# Patient Record
Sex: Female | Born: 1975 | Race: White | Hispanic: No | Marital: Married | Smoking: Former smoker
Health system: Southern US, Community
[De-identification: ages and names within clinical notes are randomized; demographics above are authoritative.]

## PROBLEM LIST (undated history)

## (undated) DIAGNOSIS — F329 Major depressive disorder, single episode, unspecified: Secondary | ICD-10-CM

## (undated) DIAGNOSIS — J45909 Unspecified asthma, uncomplicated: Secondary | ICD-10-CM

## (undated) DIAGNOSIS — F32A Depression, unspecified: Secondary | ICD-10-CM

## (undated) HISTORY — DX: Unspecified asthma, uncomplicated: J45.909

## (undated) HISTORY — PX: NASAL SINUS SURGERY: SHX719

---
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
## 2009-06-26 ENCOUNTER — Emergency Department: Payer: Self-pay | Admitting: Emergency Medicine

## 2010-05-19 IMAGING — CR RIGHT FOOT COMPLETE - 3+ VIEW
1 series · 3 of 3 positions shown · non-contrast
Comparison: none

REASON FOR EXAM: fall hurt foot
COMMENTS:

[Series 1: view not recorded · 0.17mm/px · 3 of 3 slices shown]
[im 1/3]
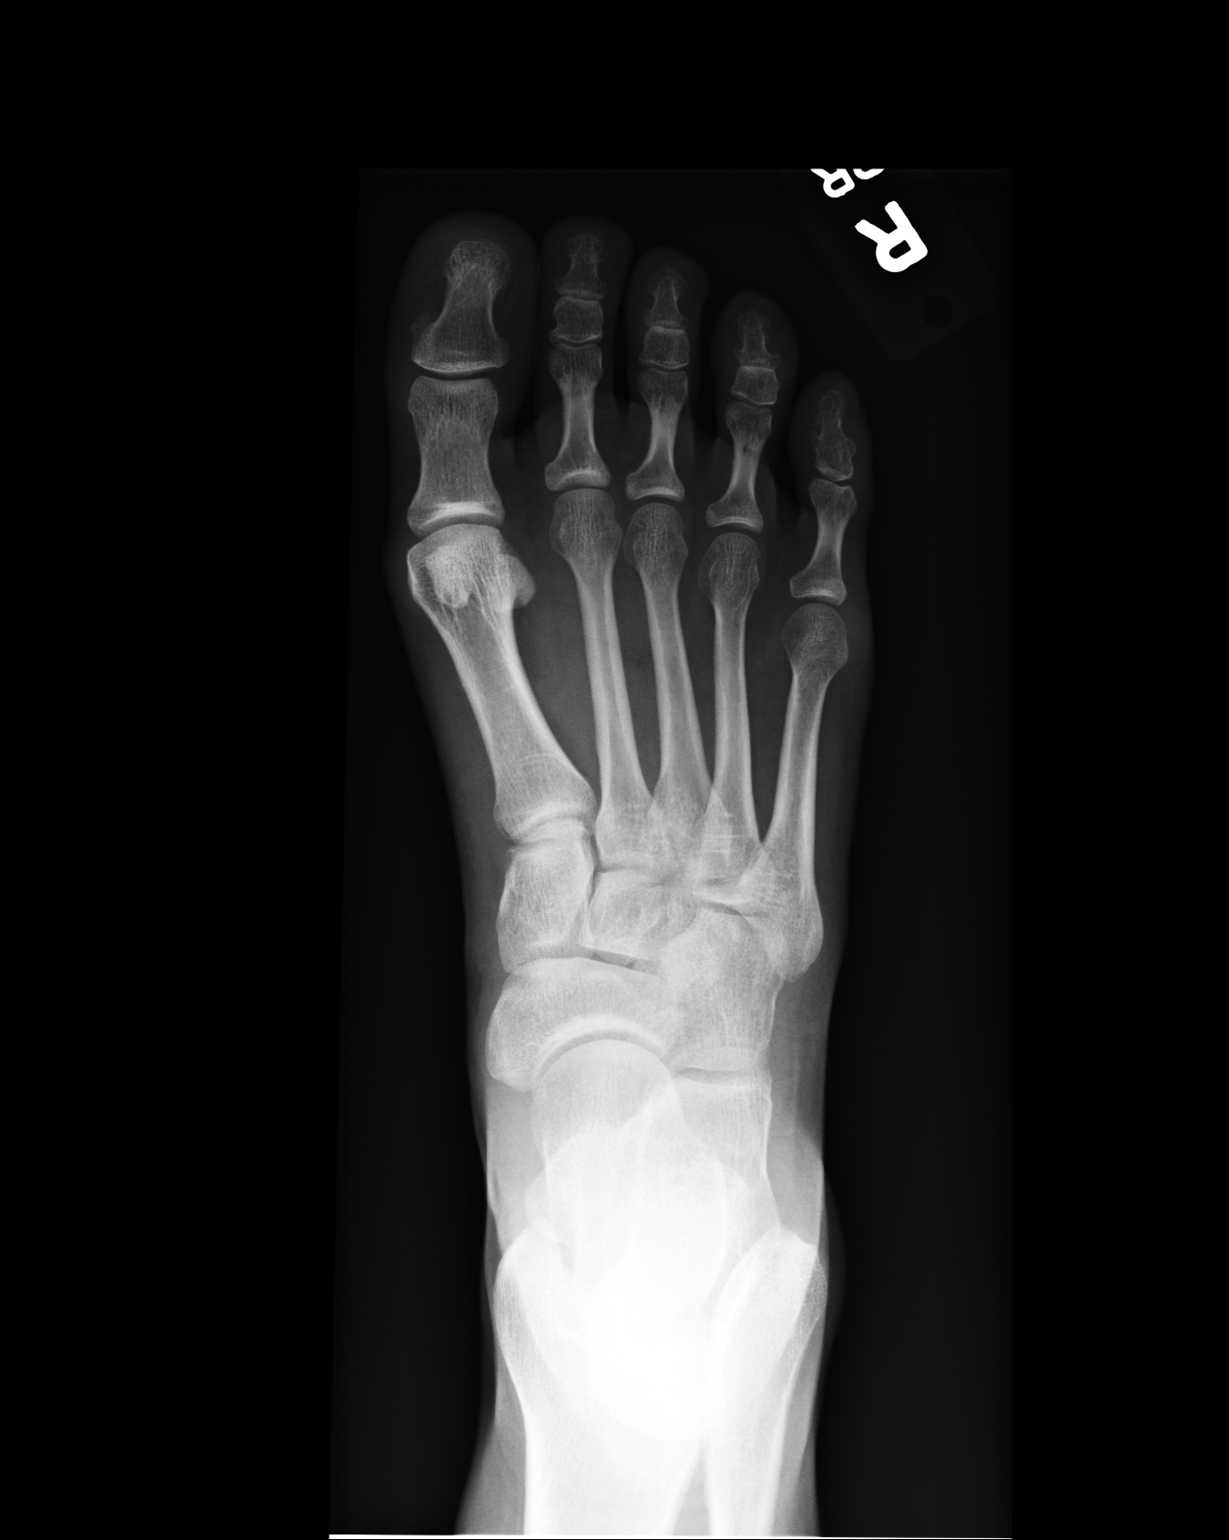
[im 2/3]
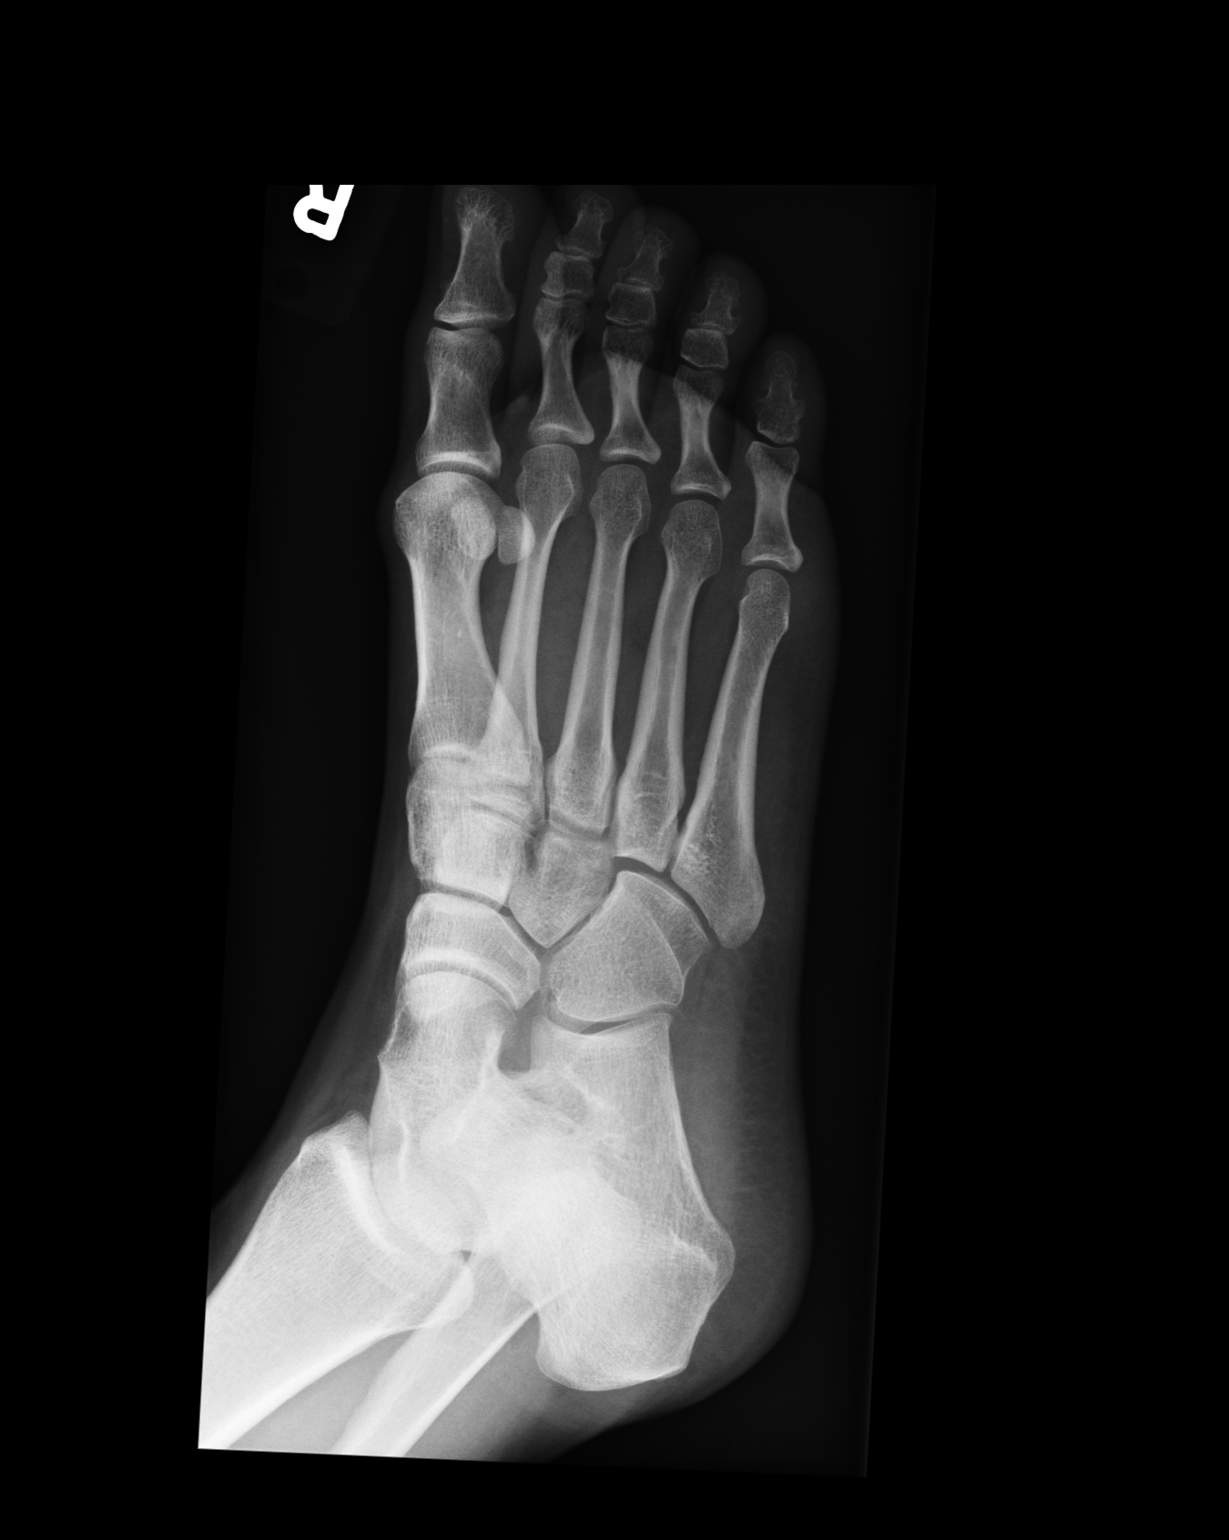
[im 3/3]
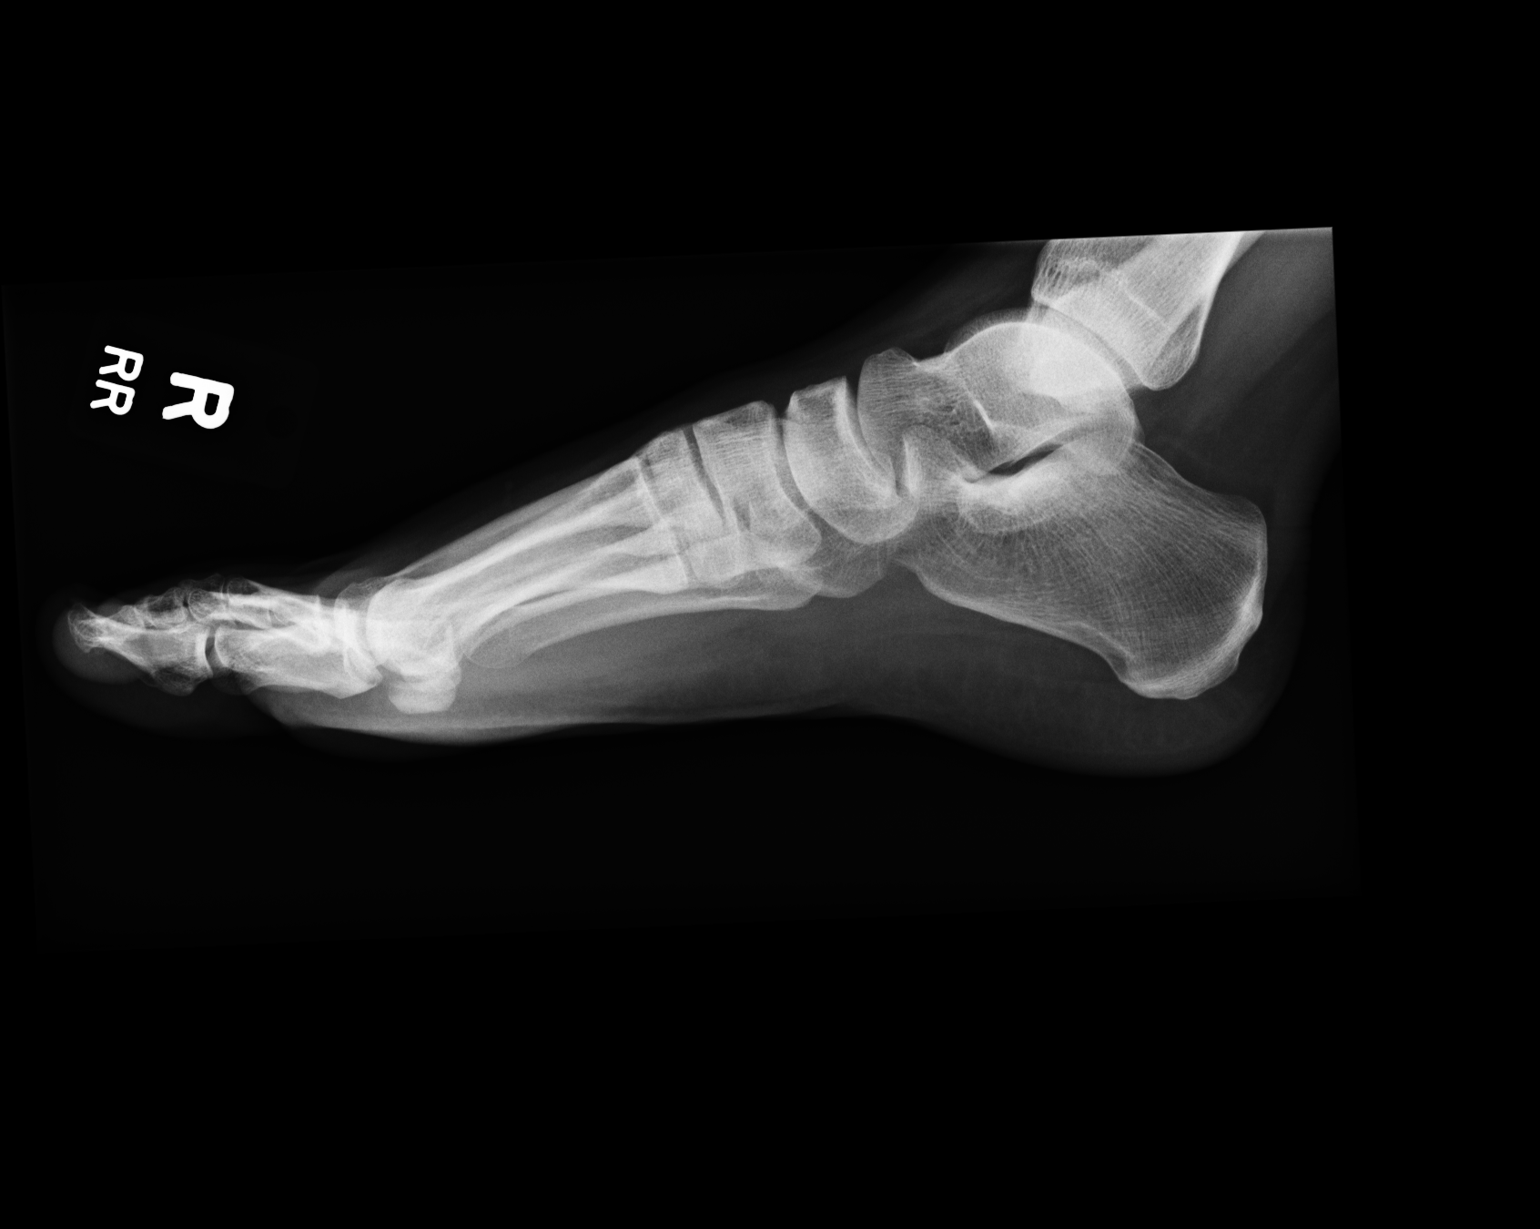

[3 of 3 positions shown; findings below may reference images not displayed]

PROCEDURE:     DXR - DXR FOOT RT COMPLETE W/OBLIQUES  - June 26, 2009 [DATE]

RESULT:     Three views of the right foot reveal the bones to be adequately
mineralized. I do not see evidence of an acute fracture.  The metatarsals
and tarsal bones are grossly intact. There may be mild soft tissue swelling
over the sole of the midfoot, but no soft tissue gas or radiopaque foreign
body is seen.
IMPRESSION: I do not see acute bony abnormality of the right foot.
There may be very mild soft tissue swelling over the dorsum of the midfoot.
No foreign body is seen and no soft tissue gas is identified.

## 2013-01-15 ENCOUNTER — Encounter: Payer: Self-pay | Admitting: Maternal and Fetal Medicine

## 2013-02-24 ENCOUNTER — Observation Stay: Payer: Self-pay | Admitting: Obstetrics and Gynecology

## 2013-02-24 LAB — CBC WITH DIFFERENTIAL/PLATELET
Basophil #: 0 10*3/uL (ref 0.0–0.1)
Basophil %: 0.3 %
Eosinophil #: 0.2 10*3/uL (ref 0.0–0.7)
HGB: 11.3 g/dL — ABNORMAL LOW (ref 12.0–16.0)
Lymphocyte %: 18.7 %
MCV: 95 fL (ref 80–100)
Monocyte %: 5.4 %
Neutrophil #: 8.9 10*3/uL — ABNORMAL HIGH (ref 1.4–6.5)
RBC: 3.55 10*6/uL — ABNORMAL LOW (ref 3.80–5.20)
RDW: 13.4 % (ref 11.5–14.5)

## 2013-02-24 LAB — RAPID URINE DRUG SCREEN, HOSP PERFORMED
Barbiturates, Ur Screen: NEGATIVE (ref ?–200)
Benzodiazepine, Ur Scrn: NEGATIVE (ref ?–200)
Cannabinoid 50 Ng, Ur ~~LOC~~: NEGATIVE (ref ?–50)
Opiate, Ur Screen: NEGATIVE (ref ?–300)

## 2013-02-24 LAB — BASIC METABOLIC PANEL
Anion Gap: 8 (ref 7–16)
Calcium, Total: 8.4 mg/dL — ABNORMAL LOW (ref 8.5–10.1)
Chloride: 106 mmol/L (ref 98–107)
Co2: 24 mmol/L (ref 21–32)
Creatinine: 0.27 mg/dL — ABNORMAL LOW (ref 0.60–1.30)
EGFR (African American): >60
EGFR (Non-African Amer.): >60
Glucose: 86 mg/dL (ref 65–99)
Potassium: 3.3 mmol/L — ABNORMAL LOW (ref 3.5–5.1)

## 2013-02-25 DIAGNOSIS — Z720 Tobacco use: Secondary | ICD-10-CM | POA: Insufficient documentation

## 2013-02-25 DIAGNOSIS — O09529 Supervision of elderly multigravida, unspecified trimester: Secondary | ICD-10-CM | POA: Insufficient documentation

## 2013-02-25 DIAGNOSIS — O4400 Placenta previa specified as without hemorrhage, unspecified trimester: Secondary | ICD-10-CM

## 2013-02-25 HISTORY — DX: Complete placenta previa nos or without hemorrhage, unspecified trimester: O44.00

## 2013-02-25 HISTORY — DX: Supervision of elderly multigravida, unspecified trimester: O09.529

## 2013-02-25 HISTORY — DX: Tobacco use: Z72.0

## 2013-02-25 LAB — CBC WITH DIFFERENTIAL/PLATELET
Eosinophil %: 0 %
HCT: 29.4 % — ABNORMAL LOW (ref 35.0–47.0)
Lymphocyte #: 1.4 10*3/uL (ref 1.0–3.6)
Lymphocyte %: 9.3 %
MCH: 32.1 pg (ref 26.0–34.0)
MCHC: 34 g/dL (ref 32.0–36.0)
MCV: 95 fL (ref 80–100)
Monocyte #: 0.3 x10 3/mm (ref 0.2–0.9)
Neutrophil #: 13.4 10*3/uL — ABNORMAL HIGH (ref 1.4–6.5)
Neutrophil %: 88.9 %
RDW: 13 % (ref 11.5–14.5)
WBC: 15.1 10*3/uL — ABNORMAL HIGH (ref 3.6–11.0)

## 2013-02-25 LAB — BASIC METABOLIC PANEL
Anion Gap: 6 — ABNORMAL LOW (ref 7–16)
BUN: 3 mg/dL — ABNORMAL LOW (ref 7–18)
Calcium, Total: 6.5 mg/dL — CL (ref 8.5–10.1)
Chloride: 107 mmol/L (ref 98–107)
Creatinine: 0.28 mg/dL — ABNORMAL LOW (ref 0.60–1.30)
EGFR (African American): >60
Glucose: 119 mg/dL — ABNORMAL HIGH (ref 65–99)
Osmolality: 268 (ref 275–301)
Potassium: 3.5 mmol/L (ref 3.5–5.1)
Sodium: 135 mmol/L — ABNORMAL LOW (ref 136–145)

## 2013-12-08 IMAGING — US US OB DETAIL+14 WK - NRPT [facility]
1 series · 14 of 28 positions shown · non-contrast
Comparison: none

[Series 1: us ob detail+14 wk - nrpt [facility] · 0.26mm/px · 14 of 135 slices shown]
[im 5/135]
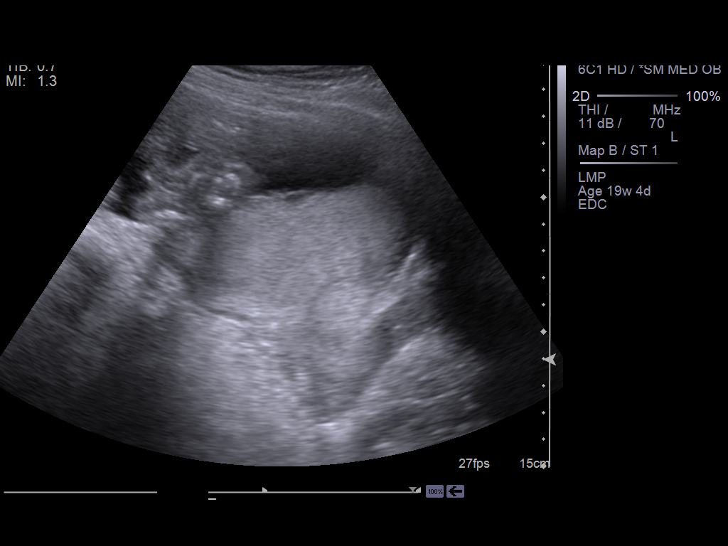
[im 15/135]
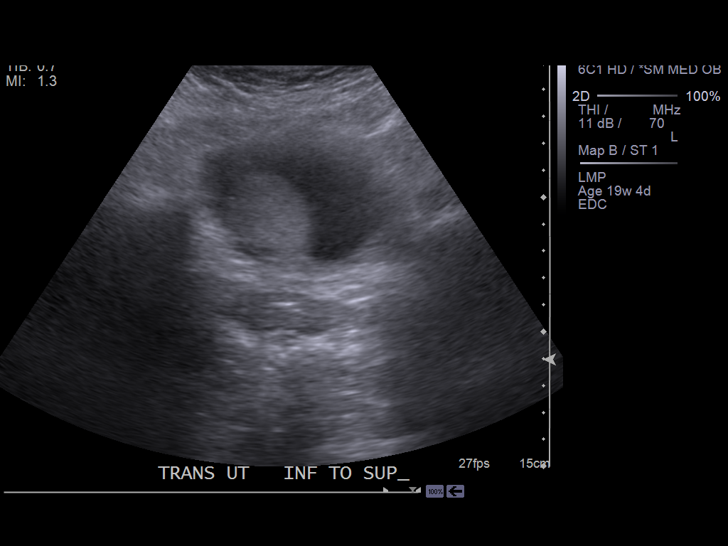
[im 25/135]
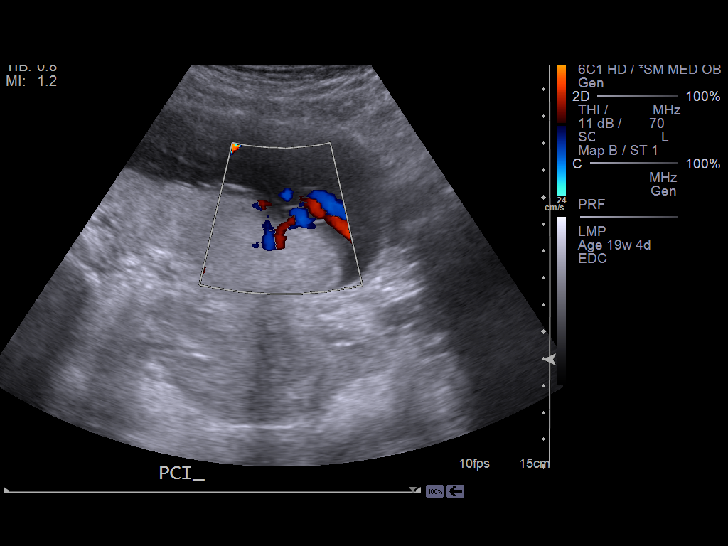
[im 35/135]
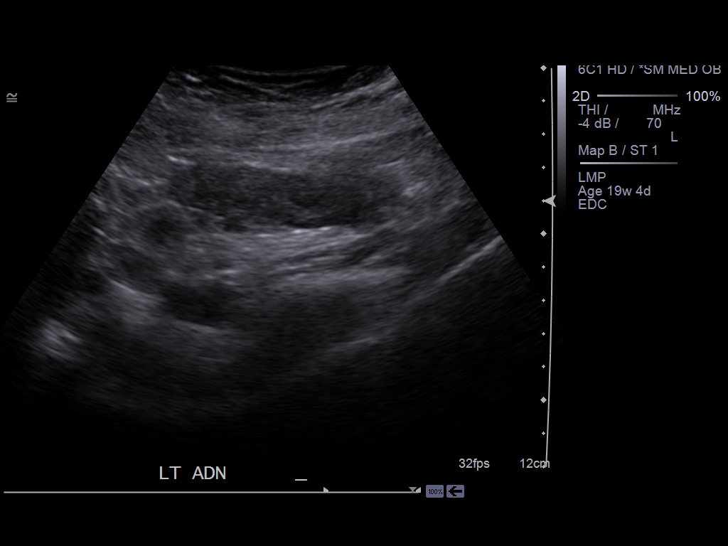
[im 45/135]
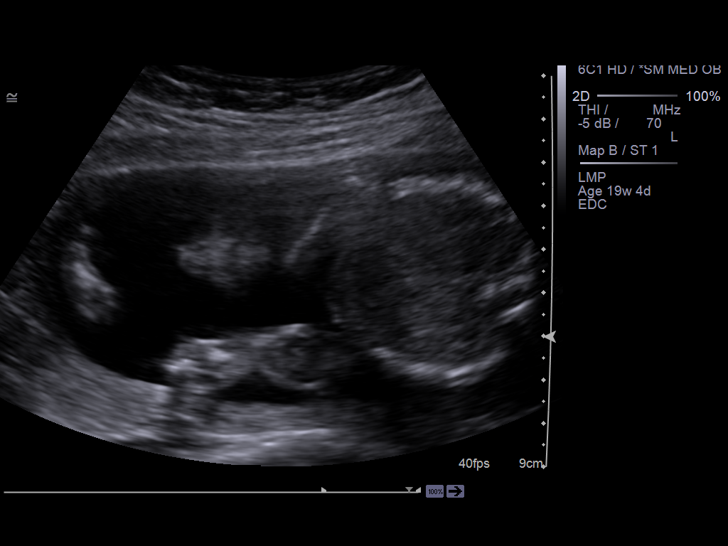
[im 55/135]
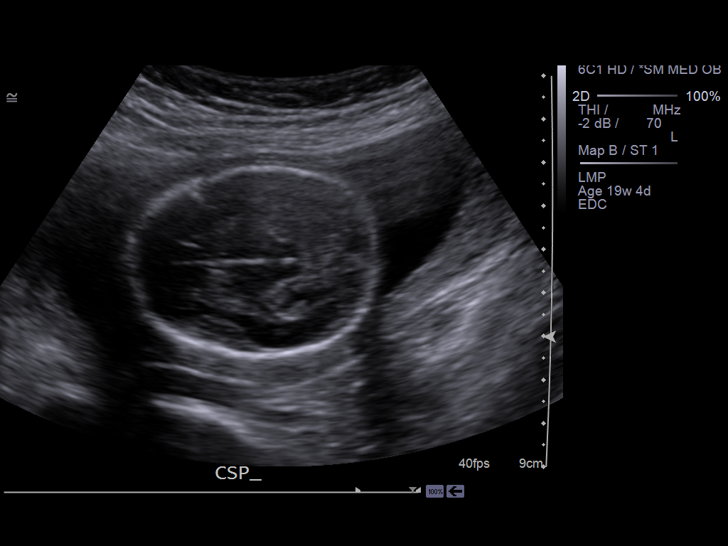
[im 65/135]
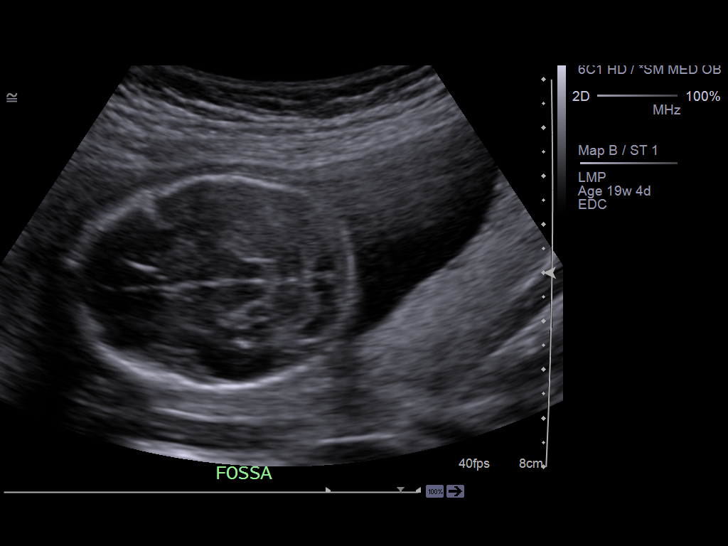
[im 75/135]
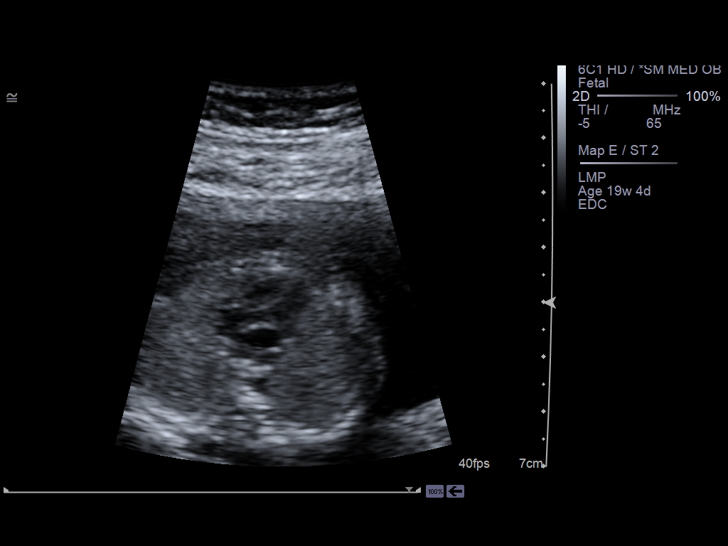
[im 85/135]
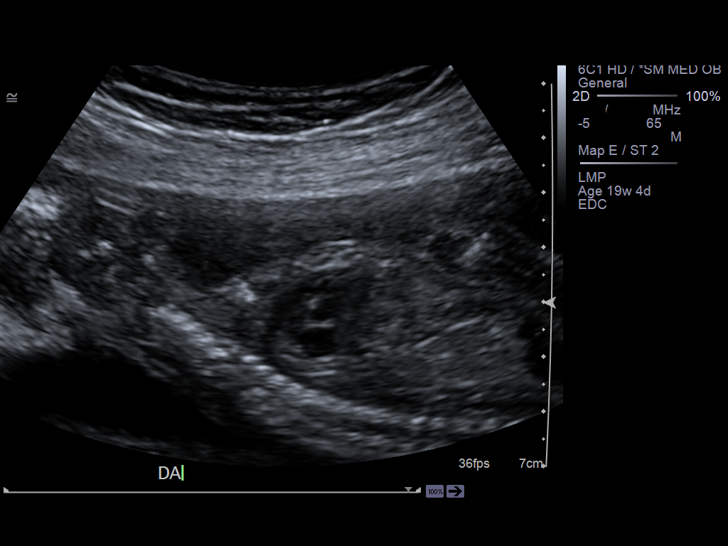
[im 95/135]
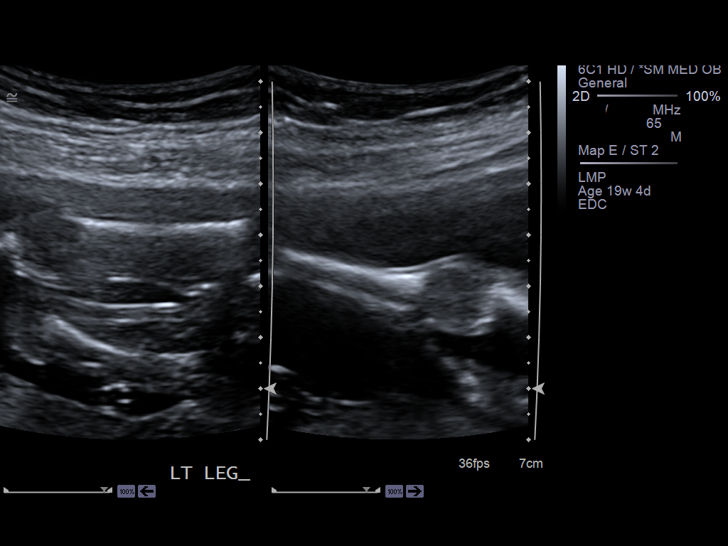
[im 105/135]
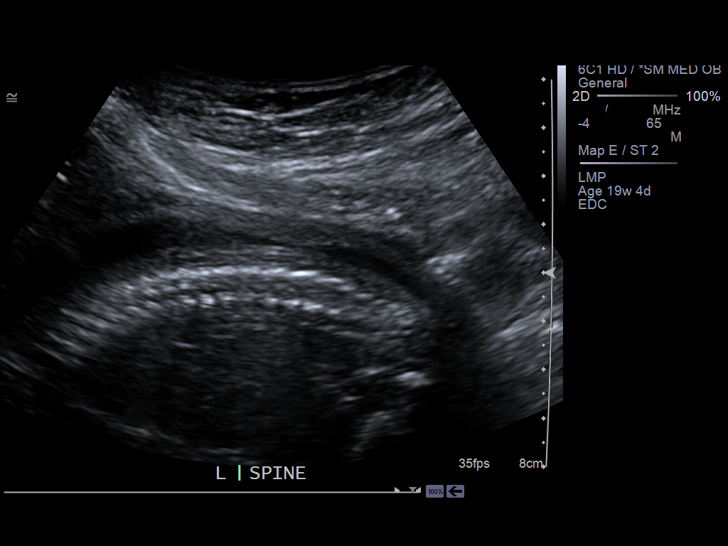
[im 115/135]
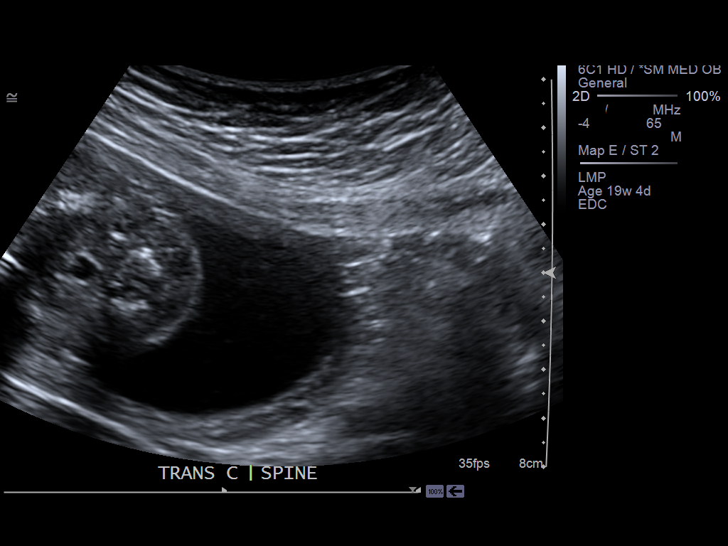
[im 125/135]
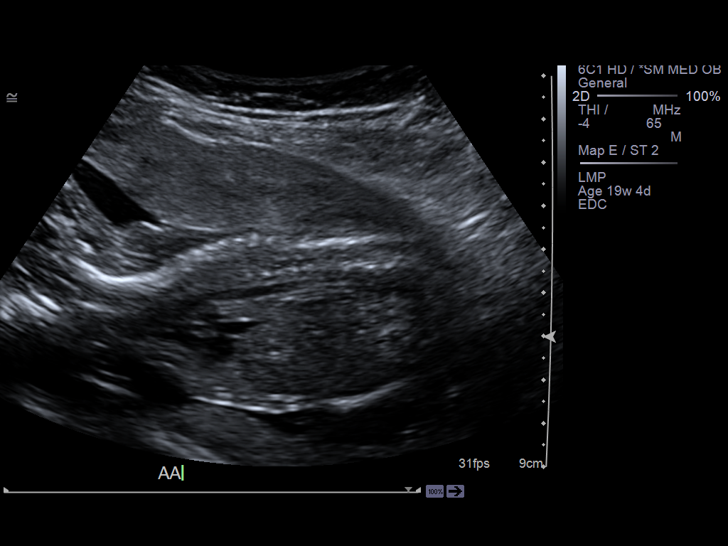
[im 135/135]
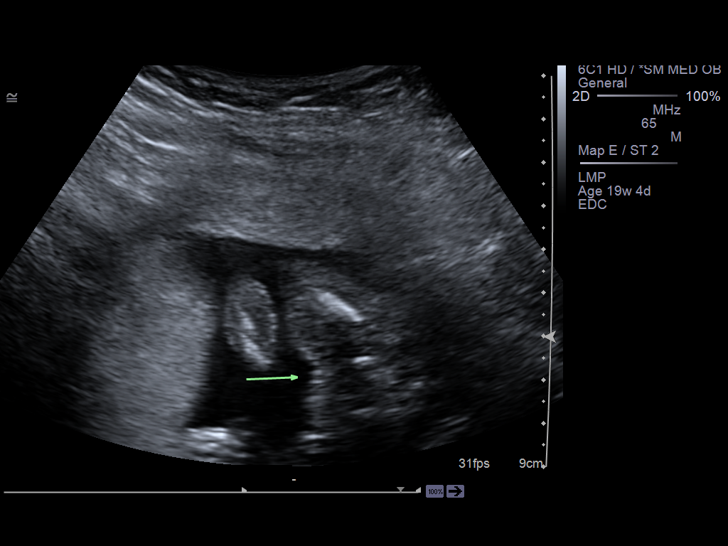

[14 of 28 positions shown; findings below may reference images not displayed]

IMAGES IMPORTED FROM THE SYN[employer] WORKFLOW SYSTEM
NO DICTATION FOR STUDY

## 2015-03-15 NOTE — H&P (Signed)
L&D Evaluation:  History:  HPI 39 yo G7P2 @ 25+1 week with known posterior placenta previa presents with passage of clots x2 size 50 cent pieces. No sexual activity and no trauma. Dating is by LMP and confirmatoy 19 week u/s by MFM. This pregnancy complikcated by  AMA, tobacco .   Patient's Medical History No Chronic Illness  anorexia remote   Patient's Surgical History sinus sx   Medications Pre Natal Vitamins   Allergies NKDA   Social History tobacco  1ppd , rare etoh in pregnancy   Family History Non-Contributory   ROS:  ROS psych: remote anorexia and depression   Exam:  Vital Signs 135/76 p=87 temp 98.3   General no apparent distress   Mental Status clear   Chest clear   Heart normal sinus rhythm   Abdomen gravid, non-tender, non tender   Back no CVAT   Edema no edema   Reflexes 1+   Pelvic deferred   FHT normal rate with no decels, 150   FHT Description irritable uterus   Fetal Heart Rate 150   Ucx irritable   Skin dry   Impression:  Impression 25 +1 week with vaginal bleeding secondary to placental previa, clinically stable. Tobacco abuse   Plan:  Plan admit to L+D , magnesium sulfate 4 gm bolus then 2 gm / hr. Ampicillin 2 gm then 1 gm q 6 hrs. Betamethasone 12.5 mg im repeat in 24 hours. CBC  Ua C+S, nicoderm patch   Electronic Signatures: Schermerhorn, Ihor Austinhomas J (MD)  (Signed 22-Apr-14 20:12)  Authored: L&D Evaluation   Last Updated: 22-Apr-14 20:12 by Schermerhorn, Ihor Austinhomas J (MD)

## 2019-07-19 ENCOUNTER — Emergency Department
Admission: EM | Admit: 2019-07-19 | Discharge: 2019-07-19 | Disposition: A | Discharge status: Home / Self Care | Payer: Self-pay | Attending: Student in an Organized Health Care Education/Training Program | Admitting: Student in an Organized Health Care Education/Training Program

## 2019-07-19 ENCOUNTER — Other Ambulatory Visit: Payer: Self-pay

## 2019-07-19 ENCOUNTER — Inpatient Hospital Stay
Admission: RE | Admit: 2019-07-19 | Discharge: 2019-07-20 | DRG: 882 | Disposition: A | Discharge status: Home / Self Care | Payer: Self-pay | Source: Intra-hospital | Attending: Psychiatry | Admitting: Psychiatry

## 2019-07-19 DIAGNOSIS — F17210 Nicotine dependence, cigarettes, uncomplicated: Secondary | ICD-10-CM | POA: Diagnosis present

## 2019-07-19 DIAGNOSIS — Y92009 Unspecified place in unspecified non-institutional (private) residence as the place of occurrence of the external cause: Secondary | ICD-10-CM

## 2019-07-19 DIAGNOSIS — F332 Major depressive disorder, recurrent severe without psychotic features: Secondary | ICD-10-CM | POA: Diagnosis present

## 2019-07-19 DIAGNOSIS — Z20828 Contact with and (suspected) exposure to other viral communicable diseases: Secondary | ICD-10-CM | POA: Insufficient documentation

## 2019-07-19 DIAGNOSIS — T404X1A Poisoning by other synthetic narcotics, accidental (unintentional), initial encounter: Secondary | ICD-10-CM | POA: Diagnosis present

## 2019-07-19 DIAGNOSIS — F4325 Adjustment disorder with mixed disturbance of emotions and conduct: Principal | ICD-10-CM | POA: Diagnosis present

## 2019-07-19 DIAGNOSIS — F1010 Alcohol abuse, uncomplicated: Secondary | ICD-10-CM

## 2019-07-19 DIAGNOSIS — F17200 Nicotine dependence, unspecified, uncomplicated: Secondary | ICD-10-CM | POA: Insufficient documentation

## 2019-07-19 DIAGNOSIS — T50902A Poisoning by unspecified drugs, medicaments and biological substances, intentional self-harm, initial encounter: Secondary | ICD-10-CM

## 2019-07-19 DIAGNOSIS — T450X1A Poisoning by antiallergic and antiemetic drugs, accidental (unintentional), initial encounter: Secondary | ICD-10-CM | POA: Diagnosis present

## 2019-07-19 DIAGNOSIS — F329 Major depressive disorder, single episode, unspecified: Secondary | ICD-10-CM | POA: Insufficient documentation

## 2019-07-19 DIAGNOSIS — Z7289 Other problems related to lifestyle: Secondary | ICD-10-CM

## 2019-07-19 HISTORY — DX: Major depressive disorder, recurrent severe without psychotic features: F33.2

## 2019-07-19 HISTORY — DX: Depression, unspecified: F32.A

## 2019-07-19 LAB — ACETAMINOPHEN LEVEL: Acetaminophen (Tylenol), Serum: <10 ug/mL — ABNORMAL LOW (ref 10–30)

## 2019-07-19 LAB — URINE DRUG SCREEN, QUALITATIVE (ARMC ONLY)
Amphetamines, Ur Screen: NOT DETECTED
Barbiturates, Ur Screen: NOT DETECTED
Benzodiazepine, Ur Scrn: NOT DETECTED
Cannabinoid 50 Ng, Ur ~~LOC~~: POSITIVE — AB
Cocaine Metabolite,Ur ~~LOC~~: NOT DETECTED
MDMA (Ecstasy)Ur Screen: NOT DETECTED
Methadone Scn, Ur: NOT DETECTED
Opiate, Ur Screen: NOT DETECTED
Phencyclidine (PCP) Ur S: NOT DETECTED
Tricyclic, Ur Screen: NOT DETECTED

## 2019-07-19 LAB — POCT PREGNANCY, URINE: Preg Test, Ur: NEGATIVE

## 2019-07-19 LAB — CK: Total CK: 147 U/L (ref 38–234)

## 2019-07-19 LAB — CBC
HCT: 39.4 % (ref 36.0–46.0)
Hemoglobin: 13.5 g/dL (ref 12.0–15.0)
MCH: 32.5 pg (ref 26.0–34.0)
MCHC: 34.3 g/dL (ref 30.0–36.0)
MCV: 94.9 fL (ref 80.0–100.0)
Platelets: 277 10*3/uL (ref 150–400)
RBC: 4.15 MIL/uL (ref 3.87–5.11)
RDW: 12.8 % (ref 11.5–15.5)
WBC: 10.1 10*3/uL (ref 4.0–10.5)
nRBC: 0 % (ref 0.0–0.2)

## 2019-07-19 LAB — COMPREHENSIVE METABOLIC PANEL
ALT: 16 U/L (ref 0–44)
AST: 17 U/L (ref 15–41)
Albumin: 4.5 g/dL (ref 3.5–5.0)
Alkaline Phosphatase: 35 U/L — ABNORMAL LOW (ref 38–126)
Anion gap: 11 (ref 5–15)
BUN: 13 mg/dL (ref 6–20)
CO2: 26 mmol/L (ref 22–32)
Calcium: 9.4 mg/dL (ref 8.9–10.3)
Chloride: 107 mmol/L (ref 98–111)
Creatinine, Ser: 0.64 mg/dL (ref 0.44–1.00)
GFR calc Af Amer: >60 mL/min (ref >60)
GFR calc non Af Amer: >60 mL/min (ref >60)
Glucose, Bld: 88 mg/dL (ref 70–99)
Potassium: 3.5 mmol/L (ref 3.5–5.1)
Sodium: 144 mmol/L (ref 135–145)
Total Bilirubin: 0.6 mg/dL (ref 0.3–1.2)
Total Protein: 7.6 g/dL (ref 6.5–8.1)

## 2019-07-19 LAB — SALICYLATE LEVEL: Salicylate Lvl: <7 mg/dL (ref 2.8–30.0)

## 2019-07-19 LAB — ETHANOL: Alcohol, Ethyl (B): 21 mg/dL — ABNORMAL HIGH (ref <10)

## 2019-07-19 LAB — SARS CORONAVIRUS 2 BY RT PCR (HOSPITAL ORDER, PERFORMED IN ~~LOC~~ HOSPITAL LAB): SARS Coronavirus 2: NEGATIVE

## 2019-07-19 MED ORDER — INFLUENZA VAC SPLIT QUAD 0.5 ML IM SUSY: 0.5000 mL | PREFILLED_SYRINGE | INTRAMUSCULAR | Status: DC

## 2019-07-19 MED ORDER — SODIUM CHLORIDE 0.9 % IV BOLUS
1000.0000 mL | Freq: Once | INTRAVENOUS | Status: AC
  Administered 2019-07-19: 1000 mL via INTRAVENOUS

## 2019-07-19 MED ORDER — MAGNESIUM HYDROXIDE 400 MG/5ML PO SUSP: 30.0000 mL | Freq: Every day | ORAL | Status: DC | PRN

## 2019-07-19 MED ORDER — ALUM & MAG HYDROXIDE-SIMETH 200-200-20 MG/5ML PO SUSP: 30.0000 mL | ORAL | Status: DC | PRN

## 2019-07-19 NOTE — ED Notes (Signed)
Spoke with Langley Gauss from poison control. Langley Gauss is officially closing the poison control case.

## 2019-07-19 NOTE — ED Notes (Signed)
Pt ambulated to the bathroom without assistance. 

## 2019-07-19 NOTE — ED Notes (Signed)
Pt states she would like her IV to be removed. MD notified and IV cath was removed

## 2019-07-19 NOTE — Consult Note (Addendum)
[facility]   Reason for Consult:  Intentional overdose Referring Physician:  EDP Patient Identification: Alyssa Cole MRN:  [id number] Principal Diagnosis: Major depressive disorder, recurrent severe without psychotic features (HCC) Diagnosis:  Principal Problem:   Major depressive disorder, recurrent severe without psychotic features (HCC)  Total Time spent with patient: 1 hour  Subjective:   Alyssa Cole is a 43 y.o. female patient admitted with suicide attempt.  She reports her depression started with COVID and her husband not working.  This created financial difficulties and she started getting "drunk" on Friday and Saturday nights.  Last night she and her husband had "a big fight."  This morning he told her he was leaving her.  She "did  Not want to be here anymore" and took 15-18 of his Ultrams left over from a surgery and a box on Unisom.  She realized after she took them, she could not do this to her 626 yo daughter and made herself throw up.  She did not see any pills.    Patient seen and evaluated in person by this provider.  Crying throughout the assessment; endorses feelings of worthlessness, guilt, helplessness.  Depressed for months with self medicating with alcohol on the weekends.  Works during the week.  Agreeable to admission.  HPI per MD:  43 y.o. female presents the ER after intentional overdose this morning.  States that she ingested several tramadol as well as Unisom and intent to harm herself.  States she also took 4 ibuprofen tablets this morning when she first woke up.  Denies any Tylenol ingestion or other medications.  States that she ingested these medications after she got into an argument with her husband who states that he was leaving her.  Past Psychiatric History: depression, anxiety  Risk to Self:  yes Risk to Others:   none Prior Inpatient Therapy:  none Prior Outpatient Therapy:  none  Past Medical History:  Past Medical  History:  Diagnosis Date  . Depression    History reviewed. No pertinent surgical history. Family History: History reviewed. No pertinent family history. Family Psychiatric  History: none Social History:  Social History   Substance and Sexual Activity  Alcohol Use Yes     Social History   Substance and Sexual Activity  Drug Use Never    Social History   Socioeconomic History  . Marital status: Married    Spouse name: Not on file  . Number of children: Not on file  . Years of education: Not on file  . Highest education level: Not on file  Occupational History  . Not on file  Social Needs  . Financial resource strain: Not on file  . Food insecurity    Worry: Not on file    Inability: Not on file  . Transportation needs    Medical: Not on file    Non-medical: Not on file  Tobacco Use  . Smoking status: Current Every Day Smoker  Substance and Sexual Activity  . Alcohol use: Yes  . Drug use: Never  . Sexual activity: Yes  Lifestyle  . Physical activity    Days per week: Not on file    Minutes per session: Not on file  . Stress: Not on file  Relationships  . Social Musicianconnections    Talks on phone: Not on file    Gets together: Not on file    Attends religious service: Not on file    Active member of club or organization: Not on  file    Attends meetings of clubs or organizations: Not on file    Relationship status: Not on file  Other Topics Concern  . Not on file  Social History Narrative  . Not on file   Additional Social History:    Allergies:  No Known Allergies  Labs:  Results for orders placed or performed during the hospital encounter of 07/19/19 (from the past 48 hour(s))  Comprehensive metabolic panel     Status: Abnormal   Collection Time: 07/19/19 11:46 AM  Result Value Ref Range   Sodium 144 135 - 145 mmol/L   Potassium 3.5 3.5 - 5.1 mmol/L   Chloride 107 98 - 111 mmol/L   CO2 26 22 - 32 mmol/L   Glucose, Bld 88 70 - 99 mg/dL   BUN 13 6 - 20  mg/dL   Creatinine, Ser 2.29 0.44 - 1.00 mg/dL   Calcium 9.4 8.9 - 79.8 mg/dL   Total Protein 7.6 6.5 - 8.1 g/dL   Albumin 4.5 3.5 - 5.0 g/dL   AST 17 15 - 41 U/L   ALT 16 0 - 44 U/L   Alkaline Phosphatase 35 (L) 38 - 126 U/L   Total Bilirubin 0.6 0.3 - 1.2 mg/dL   GFR calc non Af Amer >60 >60 mL/min   GFR calc Af Amer >60 >60 mL/min   Anion gap 11 5 - 15    Comment: Performed at [facility], [address] [place], [place] [address]  Ethanol     Status: Abnormal   Collection Time: 07/19/19 11:46 AM  Result Value Ref Range   Alcohol, Ethyl (B) 21 (H) <10 mg/dL    Comment: (NOTE) Lowest detectable limit for serum alcohol is 10 mg/dL. For medical purposes only. Performed at [facility], [address] [place], [place] [address]   Salicylate level     Status: None   Collection Time: 07/19/19 11:46 AM  Result Value Ref Range   Salicylate Lvl <7.0 2.8 - 30.0 mg/dL    Comment: Performed at [facility], [address] [place], [place] [address]  Acetaminophen level     Status: Abnormal   Collection Time: 07/19/19 11:46 AM  Result Value Ref Range   Acetaminophen (Tylenol), Serum <10 (L) 10 - 30 ug/mL    Comment: (NOTE) Therapeutic concentrations vary significantly. A range of 10-30 ug/mL  may be an effective concentration for many patients. However, some  are best treated at concentrations outside of this range. Acetaminophen concentrations >150 ug/mL at 4 hours after ingestion  and >50 ug/mL at 12 hours after ingestion are often associated with  toxic reactions. Performed at [facility], [address] [place], [place] [address]   cbc     Status: None   Collection Time: 07/19/19 11:46 AM  Result Value Ref Range   WBC 10.1 4.0 - 10.5 K/uL   RBC 4.15 3.87 - 5.11 MIL/uL   Hemoglobin 13.5 12.0 - 15.0 g/dL   HCT 14.9 70.2 - 63.7 %   MCV 94.9 80.0 - 100.0 fL   MCH 32.5 26.0 - 34.0 pg   MCHC 34.3 30.0 - 36.0 g/dL   RDW 85.8 85.0 -  27.7 %   Platelets 277 150 - 400 K/uL   nRBC 0.0 0.0 - 0.2 %    Comment: Performed at [facility], [address], [place], [place] [address]  Urine Drug Screen, Qualitative     Status: Abnormal   Collection Time: 07/19/19  11:46 AM  Result Value Ref Range   Tricyclic, Ur Screen NONE DETECTED NONE DETECTED   Amphetamines, Ur Screen NONE DETECTED NONE DETECTED   MDMA (Ecstasy)Ur Screen NONE DETECTED NONE DETECTED   Cocaine Metabolite,Ur [place] NONE DETECTED NONE DETECTED   Opiate, Ur Screen NONE DETECTED NONE DETECTED   Phencyclidine (PCP) Ur S NONE DETECTED NONE DETECTED   Cannabinoid 50 Ng, Ur  POSITIVE (A) NONE DETECTED   Barbiturates, Ur Screen NONE DETECTED NONE DETECTED   Benzodiazepine, Ur Scrn NONE DETECTED NONE DETECTED   Methadone Scn, Ur NONE DETECTED NONE DETECTED    Comment: (NOTE) Tricyclics + metabolites, urine    Cutoff 1000 ng/mL Amphetamines + metabolites, urine  Cutoff 1000 ng/mL MDMA (Ecstasy), urine              Cutoff 500 ng/mL Cocaine Metabolite, urine          Cutoff 300 ng/mL Opiate + metabolites, urine        Cutoff 300 ng/mL Phencyclidine (PCP), urine         Cutoff 25 ng/mL Cannabinoid, urine                 Cutoff 50 ng/mL Barbiturates + metabolites, urine  Cutoff 200 ng/mL Benzodiazepine, urine              Cutoff 200 ng/mL Methadone, urine                   Cutoff 300 ng/mL The urine drug screen provides only a preliminary, unconfirmed analytical test result and should not be used for non-medical purposes. Clinical consideration and professional judgment should be applied to any positive drug screen result due to possible interfering substances. A more specific alternate chemical method must be used in order to obtain a confirmed analytical result. Gas chromatography / mass spectrometry (GC/MS) is the preferred confirmat ory method. Performed at [facility], [address] [place], [place] [address]   SARS Coronavirus 2  [facility](Hospital order, Performed in [facility] hospital lab) Nasopharyngeal Nasopharyngeal Swab     Status: None   Collection Time: 07/19/19 11:46 AM   Specimen: Nasopharyngeal Swab  Result Value Ref Range   SARS Coronavirus 2 NEGATIVE NEGATIVE    Comment: (NOTE) If result is NEGATIVE SARS-CoV-2 target nucleic acids are NOT DETECTED. The SARS-CoV-2 RNA is generally detectable in upper and lower  respiratory specimens during the acute phase of infection. The lowest  concentration of SARS-CoV-2 viral copies this assay can detect is 250  copies / mL. A negative result does not preclude SARS-CoV-2 infection  and should not be used as the sole basis for treatment or other  patient management decisions.  A negative result may occur with  improper specimen collection / handling, submission of specimen other  than nasopharyngeal swab, presence of viral mutation(s) within the  areas targeted by this assay, and inadequate number of viral copies  (<250 copies / mL). A negative result must be combined with clinical  observations, patient history, and epidemiological information. If result is POSITIVE SARS-CoV-2 target nucleic acids are DETECTED. The SARS-CoV-2 RNA is generally detectable in upper and lower  respiratory specimens dur ing the acute phase of infection.  Positive  results are indicative of active infection with SARS-CoV-2.  Clinical  correlation with patient history and other diagnostic information is  necessary to determine patient infection status.  Positive results do  not rule out bacterial infection or co-infection with other viruses. If result is PRESUMPTIVE POSTIVE  SARS-CoV-2 nucleic acids MAY BE PRESENT.   A presumptive positive result was obtained on the submitted specimen  and confirmed on repeat testing.  While 2019 novel coronavirus  (SARS-CoV-2) nucleic acids may be present in the submitted sample  additional confirmatory testing may be necessary for epidemiological  and /  or clinical management purposes  to differentiate between  SARS-CoV-2 and other Sarbecovirus currently known to infect humans.  If clinically indicated additional testing with an alternate test  methodology [phone]) is advised. The SARS-CoV-2 RNA is generally  detectable in upper and lower respiratory sp ecimens during the acute  phase of infection. The expected result is Negative. Fact Sheet for Patients:  StrictlyIdeas.no Fact Sheet for Healthcare Providers: BankingDealers.co.za This test is not yet approved or cleared by the [place] FDA and has been authorized for detection and/or diagnosis of SARS-CoV-2 by FDA under an Emergency Use Authorization (EUA).  This EUA will remain in effect (meaning this test can be used) for the duration of the COVID-19 declaration under Section 564(b)(1) of the Act, 21 U.S.C. section 360bbb-3(b)(1), unless the authorization is terminated or revoked sooner. Performed at [facility], [address] [place], [place] [address]   Pregnancy, urine POC     Status: None   Collection Time: 07/19/19 11:53 AM  Result Value Ref Range   Preg Test, Ur NEGATIVE NEGATIVE    Comment:        THE SENSITIVITY OF THIS METHODOLOGY IS >24 mIU/mL     No current facility-administered medications for this encounter.    No current outpatient medications on file.    Musculoskeletal: Strength & Muscle Tone: within normal limits Gait & Station: normal Patient leans: N/A  Psychiatric Specialty Exam: Physical Exam  Nursing note and vitals reviewed. Constitutional: She is oriented to person, place, and time. She appears well-developed and well-nourished.  HENT:  Head: Normocephalic.  Neck: Normal range of motion.  Respiratory: Effort normal.  Musculoskeletal: Normal range of motion.  Neurological: She is alert and oriented to person, place, and time.  Psychiatric: Her speech is normal and behavior is  normal. Her mood appears anxious. Cognition and memory are normal. She expresses impulsivity. She exhibits a depressed mood. She expresses suicidal ideation. She expresses suicidal plans.    Review of Systems  Psychiatric/Behavioral: Positive for depression and suicidal ideas. The patient is nervous/anxious.   All other systems reviewed and are negative.   Blood pressure 125/74, pulse 80, temperature 98.3 F (36.8 C), temperature source Oral, resp. rate 11, height 5\' 3"  (1.6 m), weight 70.3 kg, last menstrual period 07/18/2019, SpO2 98 %.Body mass index is 27.46 kg/m.  General Appearance: Disheveled  Eye Contact:  Fair  Speech:  Normal Rate  Volume:  Normal  Mood:  Anxious and Depressed  Affect:  Tearful  Thought Process:  Coherent and Descriptions of Associations: Intact  Orientation:  Full (Time, Place, and Person)  Thought Content:  Rumination  Suicidal Thoughts:  Yes.  with intent/plan  Homicidal Thoughts:  No  Memory:  Immediate;   Fair Recent;   Fair Remote;   Fair  Judgement:  Poor  Insight:  Fair  Psychomotor Activity:  Decreased  Concentration:  Concentration: Fair and Attention Span: Fair  Recall:  [employer] of Knowledge:  Good  Language:  Fair  Akathisia:  No  Handed:  Right  AIMS (if indicated):     Assets:  Housing Leisure Time Physical Health Resilience Social Support  ADL's:  Intact  Cognition:  WNL  Sleep:        Treatment Plan Summary: Daily contact with patient to assess and evaluate symptoms and progress in treatment, Medication management and Plan major depressive disorder, recurrent, severe without psychosis:  -Admit to inpatient at [facility]  Disposition: Recommend psychiatric Inpatient admission when medically cleared.  Nanine Means, NP 07/19/2019 4:56 PM   Case discussed with Dr. Shaune Pollack.  Agree with plan as outlined above.

## 2019-07-19 NOTE — BH Assessment (Signed)
Assessment Note  Alyssa Cole is an 43 y.o. female who presents to the ER due to intentional overdose with the plan to end her life. Patient states she and her husband has had ongoing problems and since COVID-19 they have worsened. She also reports she didn't trust him because of past experiences. Last night (07/18/2019), they had an argument and he told her he wanted a divorce. She was still upset about it and today (07/19/2019) she took an overdose of the medication. She states, she regrets what she had done, after taking the medications. "I thought about my six-year-old daughter and how she needed me." Patient made herself "throw up" and she wasn't share if the medications "came out. I didn't see them. They weren't in a pill form."  During the interview, the patient was calm, cooperative and pleasant. She was able to provide appropriate answers to the questions. She denies HI and AV/H. She admits to the use of alcohol and cannabis. She denies history of aggression and violence. She has no involvement with the legal system.  Diagnosis: Major Depression  Past Medical History:  Past Medical History:  Diagnosis Date  . Depression     History reviewed. No pertinent surgical history.  Family History: History reviewed. No pertinent family history.  Social History:  reports that she has been smoking. She does not have any smokeless tobacco history on file. She reports current alcohol use. She reports that she does not use drugs.  Additional Social History:  Alcohol / Drug Use Pain Medications: See PTA Prescriptions: See PTA Over the Counter: See PTA History of alcohol / drug use?: Yes Longest period of sobriety (when/how long): Unable to quantify Negative Consequences of Use: Personal relationships Substance #1 Name of Substance 1: Alcohol 1 - Amount (size/oz): Two beers (8oz) 1 - Frequency: On the weekends 1 - Duration: Unable to quantify 1 - Last Use / Amount: 07/19/2019 Substance  #2 Name of Substance 2: Cannabis 2 - Last Use / Amount: 07/2019  CIWA: CIWA-Ar BP: (!) 143/89 Pulse Rate: 83 COWS:    Allergies: No Known Allergies  Home Medications: (Not in a hospital admission)   OB/GYN Status:  Patient's last menstrual period was 07/18/2019 (exact date).  General Assessment Data Location of Assessment: [facility] ED TTS Assessment: In system Is this a Tele or Face-to-Face Assessment?: Face-to-Face Is this an Initial Assessment or a Re-assessment for this encounter?: Initial Assessment Patient Accompanied by:: N/A Language Other than English: No Living Arrangements: Other (Comment)(Private Home) What gender do you identify as?: Female Marital status: Married Pregnancy Status: No Living Arrangements: Spouse/significant other, Children Can pt return to current living arrangement?: Yes Admission Status: Involuntary Petitioner: ED Attending Is patient capable of signing voluntary admission?: No(Under IVC) Referral Source: Self/Family/Friend Insurance type: None  Medical Screening Exam [facility] Walk-in ONLY) Medical Exam completed: Yes  Crisis Care Plan Living Arrangements: Spouse/significant other, Children Legal Guardian: Other:(Self) Name of Psychiatrist: Reports of none Name of Therapist: Reports of none  Education Status Is patient currently in school?: No Is the patient employed, unemployed or receiving disability?: Employed  Risk to self with the past 6 months Suicidal Ideation: Yes-Currently Present Has patient been a risk to self within the past 6 months prior to admission? : Yes Suicidal Intent: Yes-Currently Present Has patient had any suicidal intent within the past 6 months prior to admission? : Yes Is patient at risk for suicide?: Yes Suicidal Plan?: Yes-Currently Present Has patient had any suicidal plan within the past 6 months  prior to admission? : Yes Specify Current Suicidal Plan: Overdose on medications Access to Means: Yes Specify  Access to Suicidal Means: Have medications What has been your use of drugs/alcohol within the last 12 months?: Alcohol and Cannabis Previous Attempts/Gestures: Yes How many times?: 2 Other Self Harm Risks: Active Alcohol Use Triggers for Past Attempts: Family contact Intentional Self Injurious Behavior: None Family Suicide History: Unknown Recent stressful life event(s): Loss (Comment), Conflict (Comment) Persecutory voices/beliefs?: No Depression: Yes Depression Symptoms: Tearfulness, Isolating, Guilt, Feeling worthless/self pity, Loss of interest in usual pleasures Substance abuse history and/or treatment for substance abuse?: Yes Suicide prevention information given to non-admitted patients: Not applicable  Risk to Others within the past 6 months Homicidal Ideation: No Does patient have any lifetime risk of violence toward others beyond the six months prior to admission? : No Thoughts of Harm to Others: No Current Homicidal Intent: No Current Homicidal Plan: No Access to Homicidal Means: No Identified Victim: Reports of none History of harm to others?: No Assessment of Violence: None Noted Violent Behavior Description: Reports of none Does patient have access to weapons?: No Criminal Charges Pending?: No Does patient have a court date: No Is patient on probation?: No  Psychosis Hallucinations: None noted Delusions: None noted  Mental Status Report Appearance/Hygiene: Unremarkable, In scrubs Eye Contact: Fair Motor Activity: Unable to assess(Patient laying in the bed) Speech: Logical/coherent, Unremarkable, Soft, Slow Level of Consciousness: Alert Mood: Depressed, Sad, Pleasant Affect: Appropriate to circumstance, Sad, Depressed Anxiety Level: Minimal Thought Processes: Coherent, Relevant Judgement: Partial Orientation: Person, Place, Time, Situation, Appropriate for developmental age Obsessive Compulsive Thoughts/Behaviors: Minimal  Cognitive  Functioning Concentration: Normal Memory: Recent Intact, Remote Intact Is patient IDD: No Insight: Fair Impulse Control: Poor Appetite: Good Have you had any weight changes? : No Change Sleep: No Change Total Hours of Sleep: 8 Vegetative Symptoms: None  ADLScreening [facility] Assessment Services) Patient's cognitive ability adequate to safely complete daily activities?: Yes Patient able to express need for assistance with ADLs?: Yes Independently performs ADLs?: Yes (appropriate for developmental age)  Prior Inpatient Therapy Prior Inpatient Therapy: Yes Prior Therapy Dates: 1993 Prior Therapy Facilty/Provider(s): Unable to remember the name of the hospital Reason for Treatment: Depression, suicide attempt  Prior Outpatient Therapy Prior Outpatient Therapy: No Does patient have Intensive In-House Services?  : No Does patient have [facility] services? : No Does patient have P4[facility] services?: No  ADL Screening (condition at time of admission) Patient's cognitive ability adequate to safely complete daily activities?: Yes Is the patient deaf or have difficulty hearing?: No Does the patient have difficulty seeing, even when wearing glasses/contacts?: No Does the patient have difficulty concentrating, remembering, or making decisions?: No Patient able to express need for assistance with ADLs?: Yes Does the patient have difficulty dressing or bathing?: No Independently performs ADLs?: Yes (appropriate for developmental age) Does the patient have difficulty walking or climbing stairs?: No Weakness of Legs: None Weakness of Arms/Hands: None  Home Assistive Devices/Equipment Home Assistive Devices/Equipment: None  Therapy Consults (therapy consults require a physician order) PT Evaluation Needed: No OT Evalulation Needed: No SLP Evaluation Needed: No Abuse/Neglect Assessment (Assessment to be complete while patient is alone) Abuse/Neglect Assessment Can Be Completed: Yes Physical Abuse:  Denies Verbal Abuse: Denies Sexual Abuse: Denies Exploitation of patient/patient's resources: Denies Self-Neglect: Denies Values / Beliefs Cultural Requests During Hospitalization: None Spiritual Requests During Hospitalization: None Consults Spiritual Care Consult Needed: No Social Work Consult Needed: No Merchant navy officerAdvance Directives (For Healthcare) Does Patient Have a Medical  Advance Directive?: No        Child/Adolescent Assessment Running Away Risk: Denies(Patient is an adult)  Disposition:  Disposition Initial Assessment Completed for this Encounter: Yes  On Site Evaluation by:   Reviewed with Physician:    Gunnar Fusi MS, LCAS, [facility], [place] Therapeutic Triage Specialist 07/19/2019 6:03 PM

## 2019-07-19 NOTE — Plan of Care (Signed)
  Problem: Education: Goal: Utilization of techniques to improve thought processes will improve Outcome: Progressing Goal: Knowledge of the prescribed therapeutic regimen will improve Outcome: Progressing  D: Patient admitted after overdosing on pills after an argument she had with her husband the previous night. She had had 3-4 drinks. She said during the argument he said he was going to leave her so she overdosed on pills, not because she was suicidal, but because she wanted to hurt him. No prior suicide attempts. No outpatient psychiatrist. Has a church counselor she sees occasionally. Has had one prior inpatient psych hospitalization at age 43 for burning herself. Denies any current SI or any recent self harm. Skin search done with [employer], [facility] and no cuts or marks noted. Patient has green tattoo on right wrist. No contraband found. Patient's mood is sad and anxious. Affect is anxious. Denies pain. Voices no complaints. Contracts for safety on the unit. Denies depression but says she has anxiety in social situations. Denies any medical problems. Admits to drinking 3-4 alcoholic drinks twice a week and smoking marijuana daily. Smokes 1ppd, but no other tobacco use and no other drug use. No history of trauma or abuse. Oriented to unit. A: Continue to monitor for safety R: Safety maintained.

## 2019-07-19 NOTE — ED Notes (Addendum)
Pt dressed out with this RN and Elmyra Ricks, NT. Pt cell phone given to daughter. In belongings bag includes black tank top, underwear. Belongings bag also given to daughter. Ring placed in cup and given to daughter.

## 2019-07-19 NOTE — ED Notes (Signed)
Daughter at bedside for 15 min visit. Monitored by Elmyra Ricks, NT.

## 2019-07-19 NOTE — ED Notes (Signed)
Patient made aware her husband Corene Cornea stopped by. Patient is aware of visitation rules and aware her husband could not come back to visit her due to the guidelines and her daughter visiting earlier.

## 2019-07-19 NOTE — ED Notes (Signed)
Pt given phone 

## 2019-07-19 NOTE — ED Provider Notes (Signed)
[facility] Emergency Department Provider Note    First MD Initiated Contact with Patient 07/19/19 1206     (approximate)  I have reviewed the triage vital signs and the nursing notes.   HISTORY  Chief Complaint Drug Overdose    HPI Alyssa Cole is a 43 y.o. female presents the ER after intentional overdose this morning.  States that she ingested several tramadol as well as Unisom and intent to harm herself.  States she also took 4 ibuprofen tablets this morning when she first woke up.  Denies any Tylenol ingestion or other medications.  States that she ingested these medications after she got into an argument with her husband who states that he was leaving her.    Past Medical History:  Diagnosis Date  . Depression    History reviewed. No pertinent family history. History reviewed. No pertinent surgical history. There are no active problems to display for this patient.     Prior to Admission medications   Not on File    Allergies Patient has no known allergies.    Social History Social History   Tobacco Use  . Smoking status: Current Every Day Smoker  Substance Use Topics  . Alcohol use: Yes  . Drug use: Never    Review of Systems Patient denies headaches, rhinorrhea, blurry vision, numbness, shortness of breath, chest pain, edema, cough, abdominal pain, nausea, vomiting, diarrhea, dysuria, fevers, rashes or hallucinations unless otherwise stated above in HPI. ____________________________________________   PHYSICAL EXAM:  VITAL SIGNS: Vitals:   07/19/19 1445 07/19/19 1500  BP: 125/75 134/87  Pulse: 87 83  Resp: 15 11  Temp:    SpO2: 98% 100%    Constitutional: Alert and oriented.  Eyes: Conjunctivae are normal.  Head: Atraumatic. Nose: No congestion/rhinnorhea. Mouth/Throat: Mucous membranes are moist.   Neck: No stridor. Painless ROM.  Cardiovascular: Normal rate, regular rhythm. Grossly normal heart sounds.  Good  peripheral circulation. Respiratory: Normal respiratory effort.  No retractions. Lungs CTAB. Gastrointestinal: Soft and nontender. No distention. No abdominal bruits. No CVA tenderness. Genitourinary:  Musculoskeletal: No lower extremity tenderness nor edema.  No joint effusions. Neurologic:  Normal speech and language. No gross focal neurologic deficits are appreciated. No facial droop Skin:  Skin is warm, dry and intact. No rash noted. Psychiatric: Mood and affect are normal. Speech and behavior are normal.  ____________________________________________   LABS (all labs ordered are listed, but only abnormal results are displayed)  Results for orders placed or performed during the hospital encounter of 07/19/19 (from the past 24 hour(s))  Comprehensive metabolic panel     Status: Abnormal   Collection Time: 07/19/19 11:46 AM  Result Value Ref Range   Sodium 144 135 - 145 mmol/L   Potassium 3.5 3.5 - 5.1 mmol/L   Chloride 107 98 - 111 mmol/L   CO2 26 22 - 32 mmol/L   Glucose, Bld 88 70 - 99 mg/dL   BUN 13 6 - 20 mg/dL   Creatinine, Ser 9.38 0.44 - 1.00 mg/dL   Calcium 9.4 8.9 - 18.2 mg/dL   Total Protein 7.6 6.5 - 8.1 g/dL   Albumin 4.5 3.5 - 5.0 g/dL   AST 17 15 - 41 U/L   ALT 16 0 - 44 U/L   Alkaline Phosphatase 35 (L) 38 - 126 U/L   Total Bilirubin 0.6 0.3 - 1.2 mg/dL   GFR calc non Af Amer >60 >60 mL/min   GFR calc Af Amer >60 >60 mL/min  Anion gap 11 5 - 15  Ethanol     Status: Abnormal   Collection Time: 07/19/19 11:46 AM  Result Value Ref Range   Alcohol, Ethyl (B) 21 (H) <37 mg/dL  Salicylate level     Status: None   Collection Time: 07/19/19 11:46 AM  Result Value Ref Range   Salicylate Lvl <9.0 2.8 - 30.0 mg/dL  Acetaminophen level     Status: Abnormal   Collection Time: 07/19/19 11:46 AM  Result Value Ref Range   Acetaminophen (Tylenol), Serum <10 (L) 10 - 30 ug/mL  cbc     Status: None   Collection Time: 07/19/19 11:46 AM  Result Value Ref Range   WBC  10.1 4.0 - 10.5 K/uL   RBC 4.15 3.87 - 5.11 MIL/uL   Hemoglobin 13.5 12.0 - 15.0 g/dL   HCT 39.4 36.0 - 46.0 %   MCV 94.9 80.0 - 100.0 fL   MCH 32.5 26.0 - 34.0 pg   MCHC 34.3 30.0 - 36.0 g/dL   RDW 12.8 11.5 - 15.5 %   Platelets 277 150 - 400 K/uL   nRBC 0.0 0.0 - 0.2 %  Urine Drug Screen, Qualitative     Status: Abnormal   Collection Time: 07/19/19 11:46 AM  Result Value Ref Range   Tricyclic, Ur Screen NONE DETECTED NONE DETECTED   Amphetamines, Ur Screen NONE DETECTED NONE DETECTED   MDMA (Ecstasy)Ur Screen NONE DETECTED NONE DETECTED   Cocaine Metabolite,Ur [place] NONE DETECTED NONE DETECTED   Opiate, Ur Screen NONE DETECTED NONE DETECTED   Phencyclidine (PCP) Ur S NONE DETECTED NONE DETECTED   Cannabinoid 50 Ng, Ur [place] POSITIVE (A) NONE DETECTED   Barbiturates, Ur Screen NONE DETECTED NONE DETECTED   Benzodiazepine, Ur Scrn NONE DETECTED NONE DETECTED   Methadone Scn, Ur NONE DETECTED NONE DETECTED  Pregnancy, urine POC     Status: None   Collection Time: 07/19/19 11:53 AM  Result Value Ref Range   Preg Test, Ur NEGATIVE NEGATIVE   ____________________________________________  EKG My review and personal interpretation at Time: 11:45   Indication: ingestion  Rate: 90  Rhythm: sinus Axis: normal Other: normal intervals, no stemi ____________________________________________  RADIOLOGY  I personally reviewed all radiographic images ordered to evaluate for the above acute complaints and reviewed radiology reports and findings.  These findings were personally discussed with the patient.  Please see medical record for radiology report.  ____________________________________________   PROCEDURES  Procedure(s) performed:  Procedures    Critical Care performed: no ____________________________________________   INITIAL IMPRESSION / ASSESSMENT AND PLAN / ED COURSE  Pertinent labs & imaging results that were available during my care of the patient were reviewed by me and  considered in my medical decision making (see chart for details).   DDX: Psychosis, delirium, medication effect, noncompliance, polysubstance abuse, Si, Hi, depression   Alyssa Cole is a 43 y.o. who presents for evaluation of depression.  Laboratory testing was ordered to evaluation for underlying electrolyte derangement or signs of underlying organic pathology to explain today's presentation.  Based on history and physical and laboratory evaluation, it appears that the patient's presentation is 2/2 underlying psychiatric disorder and will require further evaluation and management by inpatient psychiatry.  Disposition pending psychiatric evaluation.      The patient was evaluated in Emergency Department today for the symptoms described in the history of present illness. He/she was evaluated in the context of the global COVID-19 pandemic, which necessitated consideration that the patient might be at  risk for infection with the SARS-CoV-2 virus that causes COVID-19. Institutional protocols and algorithms that pertain to the evaluation of patients at risk for COVID-19 are in a state of rapid change based on information released by regulatory bodies including the CDC and federal and state organizations. These policies and algorithms were followed during the patient's care in the ED.  As part of my medical decision making, I reviewed the following data within the electronic MEDICAL RECORD NUMBER Nursing notes reviewed and incorporated, Labs reviewed, notes from prior ED visits and [place] Controlled Substance Database   ____________________________________________   FINAL CLINICAL IMPRESSION(S) / ED DIAGNOSES  Final diagnoses:  Intentional drug overdose, initial encounter (HCC)  Depression, unspecified depression type      NEW MEDICATIONS STARTED DURING THIS VISIT:  New Prescriptions   No medications on file     Note:  This document was prepared using Dragon voice recognition software and may  include unintentional dictation errors.    Willy Eddyobinson, Anvay Tennis, MD 07/19/19 1524

## 2019-07-19 NOTE — Tx Team (Signed)
Initial Treatment Plan 07/19/2019 11:48 PM [name] VIF:[id number]    PATIENT STRESSORS: Marital or family conflict   PATIENT STRENGTHS: Ability for insight Average or above average intelligence Capable of independent living Communication skills General fund of knowledge   PATIENT IDENTIFIED PROBLEMS:   suicidal                   DISCHARGE CRITERIA:  Ability to meet basic life and health needs Adequate post-discharge living arrangements Improved stabilization in mood, thinking, and/or behavior Medical problems require only outpatient monitoring Motivation to continue treatment in a less acute level of care Need for constant or close observation no longer present Reduction of life-threatening or endangering symptoms to within safe limits Safe-care adequate arrangements made Verbal commitment to aftercare and medication compliance  PRELIMINARY DISCHARGE PLAN: Outpatient therapy Return to previous living arrangement  PATIENT/FAMILY INVOLVEMENT: This treatment plan has been presented to and reviewed with the patient, Alyssa Cole, and/or family member.  The patient and family have been given the opportunity to ask questions and make suggestions.  Libby Maw, RN 07/19/2019, 11:48 PM

## 2019-07-19 NOTE — ED Notes (Signed)
VOL/Consult completed/Rec.Inpt.Admit 

## 2019-07-19 NOTE — ED Notes (Signed)
Pt vomited clear fluid approx 3 times. Pt refuses nausea medication. MD notified.

## 2019-07-19 NOTE — Progress Notes (Signed)
D: Patient admitted after overdosing on pills after an argument she had with her husband the previous night. She had had 3-4 drinks. She said during the argument he said he was going to leave her so she overdosed on pills, not because she was suicidal, but because she wanted to hurt him. No prior suicide attempts. No outpatient psychiatrist. Has a church counselor she sees occasionally. Has had one prior inpatient psych hospitalization at age 43 for burning herself. Denies any current SI or any recent self harm. Skin search done with [facility], [facility] and no cuts or marks noted. Patient has green tattoo on right wrist. No contraband found. Patient's mood is sad and anxious. Affect is anxious. Denies pain. Voices no complaints. Contracts for safety on the unit. Denies depression but says she has anxiety in social situations. Denies any medical problems. Admits to drinking 3-4 alcoholic drinks twice a week and smoking marijuana daily. Smokes 1ppd, but no other tobacco use and no other drug use. No history of trauma or abuse. Oriented to unit. A: Continue to monitor for safety R: Safety maintained.

## 2019-07-19 NOTE — ED Triage Notes (Signed)
Pt comes EMS after taking 18 50mg  tramadol and 23 unasom at about 1045am. Pt husband wanted to leave her and this is what caused it. Pt intent was to kill herself. Pt symptoms at this time are sleepiness and some nausea.

## 2019-07-19 NOTE — ED Notes (Signed)
CBG 132 with EMS.

## 2019-07-20 DIAGNOSIS — F4325 Adjustment disorder with mixed disturbance of emotions and conduct: Secondary | ICD-10-CM

## 2019-07-20 DIAGNOSIS — F1010 Alcohol abuse, uncomplicated: Secondary | ICD-10-CM

## 2019-07-20 HISTORY — DX: Alcohol abuse, uncomplicated: F10.10

## 2019-07-20 MED ORDER — NICOTINE 21 MG/24HR TD PT24
21.0000 mg | MEDICATED_PATCH | Freq: Every day | TRANSDERMAL | Status: DC
  Administered 2019-07-20: 21 mg via TRANSDERMAL
  Filled 2019-07-20: qty 1

## 2019-07-20 NOTE — [facility] Suicide Risk Assessment (Signed)
[facility] Discharge Suicide Risk Assessment   Principal Problem: Adjustment disorder with mixed disturbance of emotions and conduct Discharge Diagnoses: Principal Problem:   Adjustment disorder with mixed disturbance of emotions and conduct Active Problems:   Alcohol abuse   Total Time spent with patient: 1 hour  Musculoskeletal: Strength & Muscle Tone: within normal limits Gait & Station: normal Patient leans: N/A  Psychiatric Specialty Exam: Review of Systems  Constitutional: Negative.   HENT: Negative.   Eyes: Negative.   Respiratory: Negative.   Cardiovascular: Negative.   Gastrointestinal: Negative.   Musculoskeletal: Negative.   Skin: Negative.   Neurological: Negative.   Psychiatric/Behavioral: Negative for depression, hallucinations, memory loss, substance abuse and suicidal ideas. The patient is not nervous/anxious and does not have insomnia.     Blood pressure (!) 136/91, pulse 79, temperature 98.5 F (36.9 C), temperature source Oral, resp. rate 17, height 5\' 3"  (1.6 m), weight 68.5 kg, last menstrual period 07/18/2019, SpO2 98 %.Body mass index is 26.75 kg/m.  General Appearance: Casual  Eye Contact::  Good  Speech:  Normal Rate409  Volume:  Normal  Mood:  Euthymic  Affect:  Congruent  Thought Process:  Goal Directed  Orientation:  Full (Time, Place, and Person)  Thought Content:  Logical  Suicidal Thoughts:  No  Homicidal Thoughts:  No  Memory:  Immediate;   Fair Recent;   Fair Remote;   Fair  Judgement:  Fair  Insight:  Fair  Psychomotor Activity:  Normal  Concentration:  Fair  Recall:  [employer] [place]  Language: Fair  Akathisia:  No  Handed:  Right  AIMS (if indicated):     Assets:  Desire for Improvement Financial Resources/Insurance Housing Physical Health Resilience Social Support  Sleep:  Number of Hours: 6.5  Cognition: WNL  ADL's:  Intact   Mental Status Per Nursing Assessment::   On Admission:  Suicidal ideation  indicated by others, Plan includes specific time, place, or method, Suicide plan  Demographic Factors:  Caucasian  Loss Factors: Financial problems/change in socioeconomic status  Historical Factors: NA  Risk Reduction Factors:   Responsible for children under 58 years of age, Sense of responsibility to family, Religious beliefs about death, Employed, Living with another person, especially a relative and Positive social support  Continued Clinical Symptoms:  Alcohol/Substance Abuse/Dependencies  Cognitive Features That Contribute To Risk:  None    Suicide Risk:  Minimal: No identifiable suicidal ideation.  Patients presenting with no risk factors but with morbid ruminations; may be classified as minimal risk based on the severity of the depressive symptoms    Plan Of Care/Follow-up recommendations:  Activity:  Activity as tolerated Diet:  Regular diet Other:  Follow-up with outpatient substance abuse and mental health treatment as recommended  Alethia Berthold, MD 07/20/2019, 12:57 PM

## 2019-07-20 NOTE — Progress Notes (Signed)
Patient ID: Alyssa Cole, female   DOB: December 28, 1975, 43 y.o.   MRN: [id number]  Discharge Note:  Patient denies SI/HI/AVH at this time. Discharge instructions, AVS, and transition record gone over with patient. Patient agrees to comply with medication management, follow-up visit, and outpatient therapy. Patient questions and concerns addressed and answered. Patient ambulatory off unit. Patient discharged to home with husband.
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
## 2019-07-20 NOTE — Progress Notes (Signed)
  [facility] Adult Case Management Discharge Plan :  Will you be returning to the same living situation after discharge:  Yes,  pt is returning home. At discharge, do you have transportation home?: Yes,  pt husband will provide transportation Do you have the ability to pay for your medications: No.  Patient reports that she does not need assistance with medications.  Per psychiatrist there are no discharge medications.   Release of information consent forms completed and in the chart;  Patient's signature needed at discharge.  Patient to Follow up at: Follow-up Information    [place] Follow up.   Why: Please follow up with [facility] if you choose to go to them for services.  Contact information was provided by Lanae Boast as well as CSW. Contact information: [place] [id number] [place] [facility] up.   Why: Please follow up with your therapist if you choose to continue to see her. Contact information: [phone]          Next level of care provider has access to [place] and Suicide Prevention discussed: Yes,  SPE completed with the patient's mother.  Have you used any form of tobacco in the last 30 days? (Cigarettes, Smokeless Tobacco, Cigars, and/or Pipes): Yes  Has patient been referred to the Quitline?: Patient refused referral  Patient has been referred for addiction treatment: Pt. refused referral  Rozann Lesches, LCSW 07/20/2019, 1:16 PM
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
## 2019-07-20 NOTE — Progress Notes (Signed)
Recreation Therapy Notes   Date: 07/20/2019  Time: 9:30 am  Location: Craft room  Behavioral response: Appropriate   Intervention Topic: Necessities   Discussion/Intervention:  Group content on today was focused on necessities. The group defined necessities and how they determine their necessities. Individuals expressed how many necessities they have and if it changes from day to day. Patients described the difference between wants and needs. The group explained how they have overspent on wants in the past. Individuals described a reoccurring necessity for them. The intervention "What I need" helped patients differentiate between wants and needs.  Clinical Observations/Feedback:  Patient came to group late due to being with peer support.  Individual was social with peers and staff while participating in group.  Alyssa Cole LRT/CTRS         Alyssa Cole 07/20/2019 11:29 AM

## 2019-07-20 NOTE — H&P (Signed)
Psychiatric Admission Assessment Adult  Patient Identification: Alyssa Cole MRN:  [id number] Date of Evaluation:  07/20/2019 Chief Complaint:  Major Depression Principal Diagnosis: Adjustment disorder with mixed disturbance of emotions and conduct Diagnosis:  Principal Problem:   Adjustment disorder with mixed disturbance of emotions and conduct Active Problems:   Alcohol abuse  History of Present Illness: Patient seen chart reviewed.  Patient came to the emergency room over the weekend after taking an impulsive overdose of pain medicine.  Patient states that she and her husband had a argument that was much worse than usual the night previously.  They both had been drinking too much.  In the morning the husband told her that he was going to leave her.  She says that she took a handful of medicine including Unisom and tramadol.  She indicates that she almost immediately called her adult children who then notified the husband who was actually still in the house at the time.  Patient was cooperative with treatment around this.  Patient denies that she had been having any depression symptoms leading up to this.  Denies depressed or anxious mood.  Denies changes in sleep patterns denies any suicidal ideation.  She does admit that she has been drinking more than usual during the coronavirus situation.  She reports that she has been drinking about a full bottle of wine both Friday and Saturday nights which is significantly more than how much she previously drank.  She has recognize that it is a problem and family has pointed out to her that her mood changes and unpleasant ways when she is drinking.  On interview today the patient denies being depressed denies suicidal thoughts denies homicidal thoughts denies any psychotic symptoms.  Not currently receiving any outpatient mental health treatment. Associated Signs/Symptoms: Depression Symptoms:  suicidal attempt, anxiety, (Hypo) Manic Symptoms:  None  reported Anxiety Symptoms:  None reported Psychotic Symptoms:  None reported PTSD Symptoms: NA Total Time spent with patient: 1 hour  Past Psychiatric History: Patient reports that she had treatment from a psychiatrist when she was an adolescent and was [facility] several days at one time.  Has not had any psychiatric treatment since being an adult.  Never engaged in any kind of substance abuse treatment.  Does use marijuana regularly and does not see it as being a problem.  No history of alcohol withdrawal symptoms.  Is the patient at risk to self? No.  Has the patient been a risk to self in the past 6 months? No.  Has the patient been a risk to self within the distant past? No.  Is the patient a risk to others? No.  Has the patient been a risk to others in the past 6 months? No.  Has the patient been a risk to others within the distant past? No.   Prior Inpatient Therapy:   Prior Outpatient Therapy:    Alcohol Screening: 1. How often do you have a drink containing alcohol?: 2 to 4 times a month 2. How many drinks containing alcohol do you have on a typical day when you are drinking?: 3 or 4 3. How often do you have six or more drinks on one occasion?: Never AUDIT-C Score: 3 4. How often during the last year have you found that you were not able to stop drinking once you had started?: Never 5. How often during the last year have you failed to do what was normally expected from you becasue of drinking?: Never 6.  How often during the last year have you needed a first drink in the morning to get yourself going after a heavy drinking session?: Never 7. How often during the last year have you had a feeling of guilt of remorse after drinking?: Never 8. How often during the last year have you been unable to remember what happened the night before because you had been drinking?: Never 9. Have you or someone else been injured as a result of your drinking?: No 10. Has a  relative or friend or a doctor or another health worker been concerned about your drinking or suggested you cut down?: No Alcohol Use Disorder Identification Test Final Score (AUDIT): 3 Alcohol Brief Interventions/Follow-up: Brief Advice Substance Abuse History in the last 12 months:  Yes.   Consequences of Substance Abuse: Medical Consequences:  This particular admission Previous Psychotropic Medications: No  Psychological Evaluations: Yes  Past Medical History:  Past Medical History:  Diagnosis Date  . Depression    History reviewed. No pertinent surgical history. Family History: History reviewed. No pertinent family history. Family Psychiatric  History: None reported Tobacco Screening: Have you used any form of tobacco in the last 30 days? (Cigarettes, Smokeless Tobacco, Cigars, and/or Pipes): Yes Tobacco use, Select all that apply: 5 or more cigarettes per day Are you interested in Tobacco Cessation Medications?: Yes, will notify MD for an order Counseled patient on smoking cessation including recognizing danger situations, developing coping skills and basic information about quitting provided: Yes Social History:  Social History   Substance and Sexual Activity  Alcohol Use Yes     Social History   Substance and Sexual Activity  Drug Use Never    Additional Social History: Marital status: Married Number of Years Married: 20 What types of issues is patient dealing with in the relationship?: Pt reports "he's watching porn and that meakes me mad and hurts my feelings it's what out fight was about". Are you sexually active?: Yes What is your sexual orientation?: Heterosexual Has your sexual activity been affected by drugs, alcohol, medication, or emotional stress?: Pt denies. Does patient have children?: Yes How many children?: 3 How is patient's relationship with their children?: Pt reports that she has 3 daughters (22, 20, 6).  She reports that the relationships with her  daughters is "great".                         Allergies:  No Known Allergies Lab Results:  Results for orders placed or performed during the hospital encounter of 07/19/19 (from the past 48 hour(s))  Comprehensive metabolic panel     Status: Abnormal   Collection Time: 07/19/19 11:46 AM  Result Value Ref Range   Sodium 144 135 - 145 mmol/L   Potassium 3.5 3.5 - 5.1 mmol/L   Chloride 107 98 - 111 mmol/L   CO2 26 22 - 32 mmol/L   Glucose, Bld 88 70 - 99 mg/dL   BUN 13 6 - 20 mg/dL   Creatinine, Ser 2.87 0.44 - 1.00 mg/dL   Calcium 9.4 8.9 - 68.1 mg/dL   Total Protein 7.6 6.5 - 8.1 g/dL   Albumin 4.5 3.5 - 5.0 g/dL   AST 17 15 - 41 U/L   ALT 16 0 - 44 U/L   Alkaline Phosphatase 35 (L) 38 - 126 U/L   Total Bilirubin 0.6 0.3 - 1.2 mg/dL   GFR calc non Af Amer >60 >60 mL/min   GFR calc Af Amer >  60 >60 mL/min   Anion gap 11 5 - 15    Comment: Performed at [facility], [address] [place], [place] [address]  Ethanol     Status: Abnormal   Collection Time: 07/19/19 11:46 AM  Result Value Ref Range   Alcohol, Ethyl (B) 21 (H) <10 mg/dL    Comment: (NOTE) Lowest detectable limit for serum alcohol is 10 mg/dL. For medical purposes only. Performed at [facility], [address] [place], [place] [address]   Salicylate level     Status: None   Collection Time: 07/19/19 11:46 AM  Result Value Ref Range   Salicylate Lvl <7.0 2.8 - 30.0 mg/dL    Comment: Performed at [facility], [address] [place], [place] [address]  Acetaminophen level     Status: Abnormal   Collection Time: 07/19/19 11:46 AM  Result Value Ref Range   Acetaminophen (Tylenol), Serum <10 (L) 10 - 30 ug/mL    Comment: (NOTE) Therapeutic concentrations vary significantly. A range of 10-30 ug/mL  may be an effective concentration for many patients. However, some  are best treated at concentrations outside of this range. Acetaminophen concentrations >150 ug/mL at 4  hours after ingestion  and >50 ug/mL at 12 hours after ingestion are often associated with  toxic reactions. Performed at [facility], [address] [place], [place] [address]   cbc     Status: None   Collection Time: 07/19/19 11:46 AM  Result Value Ref Range   WBC 10.1 4.0 - 10.5 K/uL   RBC 4.15 3.87 - 5.11 MIL/uL   Hemoglobin 13.5 12.0 - 15.0 g/dL   HCT 57.8 46.9 - 62.9 %   MCV 94.9 80.0 - 100.0 fL   MCH 32.5 26.0 - 34.0 pg   MCHC 34.3 30.0 - 36.0 g/dL   RDW 52.8 41.3 - 24.4 %   Platelets 277 150 - 400 K/uL   nRBC 0.0 0.0 - 0.2 %    Comment: Performed at [facility], [address] [place], [place] [address]  Urine Drug Screen, Qualitative     Status: Abnormal   Collection Time: 07/19/19 11:46 AM  Result Value Ref Range   Tricyclic, Ur Screen NONE DETECTED NONE DETECTED   Amphetamines, Ur Screen NONE DETECTED NONE DETECTED   MDMA (Ecstasy)Ur Screen NONE DETECTED NONE DETECTED   Cocaine Metabolite,Ur [place] NONE DETECTED NONE DETECTED   Opiate, Ur Screen NONE DETECTED NONE DETECTED   Phencyclidine (PCP) Ur S NONE DETECTED NONE DETECTED   Cannabinoid 50 Ng, Ur [place] POSITIVE (A) NONE DETECTED   Barbiturates, Ur Screen NONE DETECTED NONE DETECTED   Benzodiazepine, Ur Scrn NONE DETECTED NONE DETECTED   Methadone Scn, Ur NONE DETECTED NONE DETECTED    Comment: (NOTE) Tricyclics + metabolites, urine    Cutoff 1000 ng/mL Amphetamines + metabolites, urine  Cutoff 1000 ng/mL MDMA (Ecstasy), urine              Cutoff 500 ng/mL Cocaine Metabolite, urine          Cutoff 300 ng/mL Opiate + metabolites, urine        Cutoff 300 ng/mL Phencyclidine (PCP), urine         Cutoff 25 ng/mL Cannabinoid, urine                 Cutoff 50 ng/mL Barbiturates + metabolites, urine  Cutoff 200 ng/mL Benzodiazepine, urine  Cutoff 200 ng/mL Methadone, urine                   Cutoff 300 ng/mL The urine drug screen provides only a preliminary, unconfirmed analytical test  result and should not be used for non-medical purposes. Clinical consideration and professional judgment should be applied to any positive drug screen result due to possible interfering substances. A more specific alternate chemical method must be used in order to obtain a confirmed analytical result. Gas chromatography / mass spectrometry (GC/MS) is the preferred confirmat ory method. Performed at [facility], [address] [place], [place] [address]   SARS Coronavirus 2 [facility] order, Performed in [facility] hospital lab) Nasopharyngeal Nasopharyngeal Swab     Status: None   Collection Time: 07/19/19 11:46 AM   Specimen: Nasopharyngeal Swab  Result Value Ref Range   SARS Coronavirus 2 NEGATIVE NEGATIVE    Comment: (NOTE) If result is NEGATIVE SARS-CoV-2 target nucleic acids are NOT DETECTED. The SARS-CoV-2 RNA is generally detectable in upper and lower  respiratory specimens during the acute phase of infection. The lowest  concentration of SARS-CoV-2 viral copies this assay can detect is 250  copies / mL. A negative result does not preclude SARS-CoV-2 infection  and should not be used as the sole basis for treatment or other  patient management decisions.  A negative result may occur with  improper specimen collection / handling, submission of specimen other  than nasopharyngeal swab, presence of viral mutation(s) within the  areas targeted by this assay, and inadequate number of viral copies  (<250 copies / mL). A negative result must be combined with clinical  observations, patient history, and epidemiological information. If result is POSITIVE SARS-CoV-2 target nucleic acids are DETECTED. The SARS-CoV-2 RNA is generally detectable in upper and lower  respiratory specimens dur ing the acute phase of infection.  Positive  results are indicative of active infection with SARS-CoV-2.  Clinical  correlation with patient history and other diagnostic information is   necessary to determine patient infection status.  Positive results do  not rule out bacterial infection or co-infection with other viruses. If result is PRESUMPTIVE POSTIVE SARS-CoV-2 nucleic acids MAY BE PRESENT.   A presumptive positive result was obtained on the submitted specimen  and confirmed on repeat testing.  While 2019 novel coronavirus  (SARS-CoV-2) nucleic acids may be present in the submitted sample  additional confirmatory testing may be necessary for epidemiological  and / or clinical management purposes  to differentiate between  SARS-CoV-2 and other Sarbecovirus currently known to infect humans.  If clinically indicated additional testing with an alternate test  methodology [phone]) is advised. The SARS-CoV-2 RNA is generally  detectable in upper and lower respiratory sp ecimens during the acute  phase of infection. The expected result is Negative. Fact Sheet for Patients:  StrictlyIdeas.no Fact Sheet for Healthcare Providers: BankingDealers.co.za This test is not yet approved or cleared by the [place] FDA and has been authorized for detection and/or diagnosis of SARS-CoV-2 by FDA under an Emergency Use Authorization (EUA).  This EUA will remain in effect (meaning this test can be used) for the duration of the COVID-19 declaration under Section 564(b)(1) of the Act, 21 U.S.C. section 360bbb-3(b)(1), unless the authorization is terminated or revoked sooner. Performed at [facility], [address] [place], [place] [address]   CK     Status: None   Collection Time: 07/19/19 11:46 AM  Result Value Ref Range   Total  CK 147 38 - 234 U/L    Comment: Performed at [facility], [address] [place], [place] [address]  Pregnancy, urine POC     Status: None   Collection Time: 07/19/19 11:53 AM  Result Value Ref Range   Preg Test, Ur NEGATIVE NEGATIVE    Comment:        THE SENSITIVITY OF  THIS METHODOLOGY IS >24 mIU/mL     Blood Alcohol level:  Lab Results  Component Value Date   ETH 21 (H) 07/19/2019    Metabolic Disorder Labs:  No results found for: HGBA1C, MPG No results found for: PROLACTIN No results found for: CHOL, TRIG, HDL, CHOLHDL, VLDL, LDLCALC  Current Medications: Current Facility-Administered Medications  Medication Dose Route Frequency Provider Last Rate Last Dose  . alum & mag hydroxide-simeth (MAALOX/MYLANTA) 200-200-20 MG/5ML suspension 30 mL  30 mL Oral Q4H PRN Charm RingsLord, Jamison Y, NP      . influenza vac split quadrivalent PF (FLUARIX) injection 0.5 mL  0.5 mL Intramuscular Tomorrow-1000 ,  T, MD      . magnesium hydroxide (MILK OF MAGNESIA) suspension 30 mL  30 mL Oral Daily PRN Charm RingsLord, Jamison Y, NP      . nicotine (NICODERM CQ - dosed in mg/24 hours) patch 21 mg  21 mg Transdermal Daily , Jackquline Denmark T, MD   21 mg at 07/20/19 [id number]   PTA Medications: No medications prior to admission.    Musculoskeletal: Strength & Muscle Tone: within normal limits Gait & Station: normal Patient leans: N/A  Psychiatric Specialty Exam: Physical Exam  Nursing note and vitals reviewed. Constitutional: She appears well-developed and well-nourished.  HENT:  Head: Normocephalic and atraumatic.  Eyes: Pupils are equal, round, and reactive to light. Conjunctivae are normal.  Neck: Normal range of motion.  Cardiovascular: Regular rhythm and normal heart sounds.  Respiratory: Effort normal. No respiratory distress.  GI: Soft.  Musculoskeletal: Normal range of motion.  Neurological: She is alert.  Skin: Skin is warm and dry.  Psychiatric: Her speech is normal and behavior is normal. Judgment and thought content normal. Her mood appears anxious. Her affect is not blunt. Her speech is not delayed. She is not slowed. Cognition and memory are normal.    Review of Systems  Constitutional: Negative.   HENT: Negative.   Eyes: Negative.   Respiratory:  Negative.   Cardiovascular: Negative.   Gastrointestinal: Negative.   Musculoskeletal: Negative.   Skin: Negative.   Neurological: Negative.   Psychiatric/Behavioral: Positive for substance abuse. Negative for depression, hallucinations, memory loss and suicidal ideas. The patient is not nervous/anxious and does not have insomnia.     Blood pressure (!) 136/91, pulse 79, temperature 98.5 F (36.9 C), temperature source Oral, resp. rate 17, height 5\' 3"  (1.6 m), weight 68.5 kg, last menstrual period 07/18/2019, SpO2 98 %.Body mass index is 26.75 kg/m.  General Appearance: Casual  Eye Contact:  Good  Speech:  Clear and Coherent  Volume:  Normal  Mood:  Euthymic  Affect:  Congruent  Thought Process:  Goal Directed  Orientation:  Full (Time, Place, and Person)  Thought Content:  Logical  Suicidal Thoughts:  No  Homicidal Thoughts:  No  Memory:  Immediate;   Fair Recent;   Fair Remote;   Fair  Judgement:  Fair  Insight:  Fair  Psychomotor Activity:  Normal  Concentration:  Concentration: Fair  Recall:  Fis[employer]Fair  Fund of Knowledge:  Fair  Language:  Fair  Akathisia:  No  Handed:  Right  AIMS (if indicated):     Assets:  Desire for Improvement  ADL's:  Intact  Cognition:  WNL  Sleep:  Number of Hours: 6.5    Treatment Plan Summary: Daily contact with patient to assess and evaluate symptoms and progress in treatment, Medication management and Plan Patient at this point is not endorsing symptoms of major depression.  Does not have a clear need for psychiatric medicine.  Denies suicidal ideation.  Not showing significant signs of withdrawal.  She is showing good insight and is open to making some changes in her behavior.  At this time it does not appear that she requires further inpatient hospitalization.  Patient will be discharged home with recommendation that she follow-up with therapy for alcohol abuse.  Strongly recommend that she discontinue all alcohol abuse for the time being.   Patient agreeable to plan.  Case reviewed with social work.  Discontinue IVC.  Observation Level/Precautions:  15 minute checks  Laboratory:  Chemistry Profile  Psychotherapy:    Medications:    Consultations:    Discharge Concerns:    Estimated LOS:  Other:     Physician Treatment Plan for Primary Diagnosis: Adjustment disorder with mixed disturbance of emotions and conduct Long Term Goal(s): Improvement in symptoms so as ready for discharge  Short Term Goals: Ability to verbalize feelings will improve and Ability to demonstrate self-control will improve  Physician Treatment Plan for Secondary Diagnosis: Principal Problem:   Adjustment disorder with mixed disturbance of emotions and conduct Active Problems:   Alcohol abuse  Long Term Goal(s): Improvement in symptoms so as ready for discharge  Short Term Goals: Ability to identify and develop effective coping behaviors will improve  I certify that inpatient services furnished can reasonably be expected to improve the patient's condition.    Mordecai Rasmussen, MD 9/14/20201:02 PM
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
## 2019-07-20 NOTE — Discharge Summary (Signed)
Physician Discharge Summary Note  Patient:  Alyssa Cole is an 43 y.o., female MRN:  [id number] DOB:  August 24, 1976 Patient phone:  [phone] (home)  Patient address:   [address] [place] [address],  Total Time spent with patient: 45 minutes  Date of Admission:  07/19/2019 Date of Discharge: July 20, 2019  Reason for Admission: Admitted through the emergency room where she presented after taking an overdose of mixed sleeping and pain medicine  Principal Problem: Adjustment disorder with mixed disturbance of emotions and conduct Discharge Diagnoses: Principal Problem:   Adjustment disorder with mixed disturbance of emotions and conduct Active Problems:   Alcohol abuse   Past Psychiatric History: No psychiatric treatment as an adult.  No prior suicide attempts.  One psychiatric admission for behavior problems as a teenager.  Past Medical History:  Past Medical History:  Diagnosis Date  . Depression    History reviewed. No pertinent surgical history. Family History: History reviewed. No pertinent family history. Family Psychiatric  History: None reported Social History:  Social History   Substance and Sexual Activity  Alcohol Use Yes     Social History   Substance and Sexual Activity  Drug Use Never    Social History   Socioeconomic History  . Marital status: Married    Spouse name: Not on file  . Number of children: Not on file  . Years of education: Not on file  . Highest education level: Not on file  Occupational History  . Not on file  Social Needs  . Financial resource strain: Not on file  . Food insecurity    Worry: Not on file    Inability: Not on file  . Transportation needs    Medical: Not on file    Non-medical: Not on file  Tobacco Use  . Smoking status: Current Every Day Smoker    Packs/day: 1.00    Years: 15.00    Pack years: 15.00    Types: Cigarettes  . Smokeless tobacco: Never Used  Substance and Sexual Activity  . Alcohol  use: Yes  . Drug use: Never  . Sexual activity: Yes  Lifestyle  . Physical activity    Days per week: Not on file    Minutes per session: Not on file  . Stress: Not on file  Relationships  . Social Herbalist on phone: Not on file    Gets together: Not on file    Attends religious service: Not on file    Active member of club or organization: Not on file    Attends meetings of clubs or organizations: Not on file    Relationship status: Not on file  Other Topics Concern  . Not on file  Social History Narrative  . Not on file    Hospital Course: Admitted to the psychiatric ward.  No detox orders placed.  Patient was calm and cooperative.  No behavior problems.  Denied suicidal ideation.  Blood pressure slightly elevated but otherwise nothing remarkable physically.  No seizures no sign of delirium.  Patient shows good insight into her problems and is agreeable to outpatient treatment.  Completely denies suicidal ideation at this point and is not reporting a recent major depression.  Psychoeducation and supportive counseling about recent stresses.  Strongly encouraged her to discontinue alcohol use.  She will be given referrals to local mental health providers.  Patient is agreeable to the plan.  At this point does not meet commitment criteria does not require further  inpatient treatment and will be discharged home.  Physical Findings: AIMS: Facial and Oral Movements Muscles of Facial Expression: None, normal Lips and Perioral Area: None, normal Jaw: None, normal Tongue: None, normal,Extremity Movements Upper (arms, wrists, hands, fingers): None, normal Lower (legs, knees, ankles, toes): None, normal, Trunk Movements Neck, shoulders, hips: None, normal, Overall Severity Severity of abnormal movements (highest score from questions above): None, normal Incapacitation due to abnormal movements: None, normal Patient's awareness of abnormal movements (rate only patient's report):  No Awareness, Dental Status Current problems with teeth and/or dentures?: No Does patient usually wear dentures?: No  CIWA:    COWS:     Musculoskeletal: Strength & Muscle Tone: within normal limits Gait & Station: normal Patient leans: N/A  Psychiatric Specialty Exam: Physical Exam  Nursing note and vitals reviewed. Constitutional: She appears well-developed and well-nourished.  HENT:  Head: Normocephalic and atraumatic.  Eyes: Pupils are equal, round, and reactive to light. Conjunctivae are normal.  Neck: Normal range of motion.  Cardiovascular: Regular rhythm and normal heart sounds.  Respiratory: Effort normal. No respiratory distress.  GI: Soft.  Musculoskeletal: Normal range of motion.  Neurological: She is alert.  Skin: Skin is warm and dry.  Psychiatric: She has a normal mood and affect. Her speech is normal and behavior is normal. Judgment and thought content normal. Cognition and memory are normal.    Review of Systems  Constitutional: Negative.   HENT: Negative.   Eyes: Negative.   Respiratory: Negative.   Cardiovascular: Negative.   Gastrointestinal: Negative.   Musculoskeletal: Negative.   Skin: Negative.   Neurological: Negative.   Psychiatric/Behavioral: Positive for substance abuse. Negative for depression, hallucinations, memory loss and suicidal ideas. The patient is not nervous/anxious and does not have insomnia.     Blood pressure (!) 136/91, pulse 79, temperature 98.5 F (36.9 C), temperature source Oral, resp. rate 17, height 5\' 3"  (1.6 m), weight 68.5 kg, last menstrual period 07/18/2019, SpO2 98 %.Body mass index is 26.75 kg/m.  General Appearance: Casual  Eye Contact:  Good  Speech:  Clear and Coherent  Volume:  Normal  Mood:  Euthymic  Affect:  Congruent  Thought Process:  Goal Directed  Orientation:  Full (Time, Place, and Person)  Thought Content:  Logical  Suicidal Thoughts:  No  Homicidal Thoughts:  No  Memory:  Immediate;    Fair Recent;   Fair Remote;   Fair  Judgement:  Fair  Insight:  Fair  Psychomotor Activity:  Normal  Concentration:  Concentration: Fair  Recall:  [employer]Fair  Fund of Knowledge:  Fair  Language:  Fair  Akathisia:  Negative  Handed:  Right  AIMS (if indicated):     Assets:  Desire for Improvement Housing Physical Health Resilience  ADL's:  Intact  Cognition:  WNL  Sleep:  Number of Hours: 6.5     Have you used any form of tobacco in the last 30 days? (Cigarettes, Smokeless Tobacco, Cigars, and/or Pipes): Yes  Has this patient used any form of tobacco in the last 30 days? (Cigarettes, Smokeless Tobacco, Cigars, and/or Pipes) Yes, Yes, A prescription for an FDA-approved tobacco cessation medication was offered at discharge and the patient refused  Blood Alcohol level:  Lab Results  Component Value Date   ETH 21 (H) 07/19/2019    Metabolic Disorder Labs:  No results found for: HGBA1C, MPG No results found for: PROLACTIN No results found for: CHOL, TRIG, HDL, CHOLHDL, VLDL, LDLCALC  See Psychiatric Specialty Exam and Suicide Risk  Assessment completed by Attending Physician prior to discharge.  Discharge destination:  Home  Is patient on multiple antipsychotic therapies at discharge:  No   Has Patient had three or more failed trials of antipsychotic monotherapy by history:  No  Recommended Plan for Multiple Antipsychotic Therapies: NA  Discharge Instructions    Diet - low sodium heart healthy   Complete by: As directed    Increase activity slowly   Complete by: As directed      Allergies as of 07/20/2019   No Known Allergies     Medication List    You have not been prescribed any medications.      Follow-up recommendations:  Activity:  Activity as tolerated Diet:  Regular diet Other:  Follow-up outpatient treatment as recommended discontinue alcohol use  Comments: Patient calm and cooperative and agreeable to treatment.  No longer meets commitment  criteria.  Signed: Mordecai Rasmussen, MD 07/20/2019, 1:10 PM

## 2019-07-20 NOTE — Plan of Care (Signed)
D- Patient alert and oriented. Patient presents in a pleasant mood on assessment stating that she slept "so-so" last night and had no complaints to voice to this Probation officer. Patient denies any signs/symptoms of depression/anxiety to this Probation officer. Patient also denies SI, HI, AVH, and pain at this time. Patient's goal for today is to "see my husband and reconcile my problems with him".  A- Scheduled medications administered to patient, per MD orders. Support and encouragement provided.  Routine safety checks conducted every 15 minutes.  Patient informed to notify staff with problems or concerns.  R- No adverse drug reactions noted. Patient contracts for safety at this time. Patient compliant with medications and treatment plan. Patient receptive, calm, and cooperative. Patient interacts well with others on the unit.  Patient remains safe at this time.  Problem: Education: Goal: Utilization of techniques to improve thought processes will improve Outcome: Progressing Goal: Knowledge of the prescribed therapeutic regimen will improve Outcome: Progressing   Problem: Activity: Goal: Interest or engagement in leisure activities will improve Outcome: Progressing Goal: Imbalance in normal sleep/wake cycle will improve Outcome: Progressing   Problem: Coping: Goal: Coping ability will improve Outcome: Progressing Goal: Will verbalize feelings Outcome: Progressing   Problem: Health Behavior/Discharge Planning: Goal: Ability to make decisions will improve Outcome: Progressing Goal: Compliance with therapeutic regimen will improve Outcome: Progressing   Problem: Role Relationship: Goal: Will demonstrate positive changes in social behaviors and relationships Outcome: Progressing   Problem: Safety: Goal: Ability to disclose and discuss suicidal ideas will improve Outcome: Progressing Goal: Ability to identify and utilize support systems that promote safety will improve Outcome: Progressing    Problem: Self-Concept: Goal: Will verbalize positive feelings about self Outcome: Progressing Goal: Level of anxiety will decrease Outcome: Progressing   Problem: Education: Goal: Knowledge of [facility] Education information/materials will improve Outcome: Progressing Goal: Emotional status will improve Outcome: Progressing Goal: Mental status will improve Outcome: Progressing Goal: Verbalization of understanding the information provided will improve Outcome: Progressing   Problem: Activity: Goal: Interest or engagement in activities will improve Outcome: Progressing Goal: Sleeping patterns will improve Outcome: Progressing   Problem: Coping: Goal: Ability to verbalize frustrations and anger appropriately will improve Outcome: Progressing Goal: Ability to demonstrate self-control will improve Outcome: Progressing   Problem: Health Behavior/Discharge Planning: Goal: Identification of resources available to assist in meeting health care needs will improve Outcome: Progressing Goal: Compliance with treatment plan for underlying cause of condition will improve Outcome: Progressing   Problem: Physical Regulation: Goal: Ability to maintain clinical measurements within normal limits will improve Outcome: Progressing   Problem: Safety: Goal: Periods of time without injury will increase Outcome: Progressing

## 2019-11-04 ENCOUNTER — Other Ambulatory Visit: Payer: Self-pay

## 2019-11-04 ENCOUNTER — Ambulatory Visit
Admission: EM | Admit: 2019-11-04 | Discharge: 2019-11-04 | Disposition: A | Discharge status: Home / Self Care | Payer: HRSA Program | Attending: Family Medicine | Admitting: Family Medicine

## 2019-11-04 DIAGNOSIS — Z20828 Contact with and (suspected) exposure to other viral communicable diseases: Secondary | ICD-10-CM | POA: Diagnosis present

## 2019-11-04 DIAGNOSIS — Z20822 Contact with and (suspected) exposure to covid-19: Secondary | ICD-10-CM

## 2019-11-04 NOTE — Discharge Instructions (Signed)
Results available in 24 to 48 hours.  Stay home.  Take care  Dr. Heather Mckendree   

## 2019-11-04 NOTE — ED Triage Notes (Signed)
Patient reports that she was exposed by her daughter who is positive for Covid. Reports that she is having sore throat, upset stomach and headaches with nasal congestion x Sunday.

## 2019-11-04 NOTE — ED Provider Notes (Signed)
[facility]    CSN: [id number] Arrival date & time: 11/04/19  3532      History   Chief Complaint Chief Complaint  Patient presents with  . covid exposure  . Sore Throat   HPI  43 year old female presents with recent Covid exposure.  She is now symptomatic.  Patient reports that her symptoms started on Sunday.  She reports sore throat, upset stomach, headaches, congestion.  No fever.  Her husband is also sick.  Rates her pain as 4/10 in severity.  No known exacerbating or relieving factors.  No other associated symptoms.  No other complaints.  PMH, Surgical Hx, Family Hx, Social History reviewed and updated as below.  Past Medical History:  Diagnosis Date  . Depression     Patient Active Problem List   Diagnosis Date Noted  . Adjustment disorder with mixed disturbance of emotions and conduct 07/20/2019  . Alcohol abuse 07/20/2019  . Major depressive disorder, recurrent severe without psychotic features ([place]) 07/19/2019   History reviewed. No pertinent surgical history.  OB History   No obstetric history on file.      Home Medications    Prior to Admission medications   Medication Sig Start Date End Date Taking? Authorizing Provider  lamoTRIgine (LAMICTAL) 100 MG tablet Take 100 mg by mouth at bedtime. 10/06/19  Yes [provider]  lamoTRIgine (LAMICTAL) 25 MG tablet Take 75 mg by mouth at bedtime. 09/29/19  Yes [provider]  traZODone (DESYREL) 50 MG tablet Take 50 mg by mouth every evening. 10/06/19  Yes [provider]    Family History Family History  Problem Relation Age of Onset  . Hypertension Mother   . Hypertension Father     Social History Social History   Tobacco Use  . Smoking status: Former Smoker    Packs/day: 1.00    Years: 15.00    Pack years: 15.00  . Smokeless tobacco: Never Used  Substance Use Topics  . Alcohol use: Not Currently  . Drug use: Never     Allergies   Patient has no  known allergies.   Review of Systems Review of Systems  Constitutional: Negative for fever.  HENT: Positive for congestion and sore throat.   Gastrointestinal: Positive for diarrhea.  Neurological: Positive for headaches.   Physical Exam Triage Vital Signs ED Triage Vitals  Enc Vitals Group     BP 11/04/19 0913 113/79     Pulse Rate 11/04/19 0913 86     Resp 11/04/19 0913 16     Temp 11/04/19 0913 98.3 F (36.8 C)     Temp Source 11/04/19 0913 Oral     SpO2 11/04/19 0913 99 %     Weight 11/04/19 0910 150 lb (68 kg)     Height 11/04/19 0910 5\' 3"  (1.6 m)     Head Circumference --      Peak Flow --      Pain Score 11/04/19 0909 4     Pain Loc --      Pain Edu? --      Excl. in [place]? --    Updated Vital Signs BP 113/79 (BP Location: Left Arm)   Pulse 86   Temp 98.3 F (36.8 C) (Oral)   Resp 16   Ht 5\' 3"  (1.6 m)   Wt 68 kg   LMP 11/04/2019   SpO2 99%   BMI 26.57 kg/m   Visual Acuity Right Eye Distance:   Left Eye Distance:   Bilateral  Distance:    Right Eye Near:   Left Eye Near:    Bilateral Near:     Physical Exam Vitals and nursing note reviewed.  Constitutional:      General: She is not in acute distress.    Appearance: Normal appearance. She is not ill-appearing.  HENT:     Head: Normocephalic and atraumatic.  Eyes:     General:        Right eye: No discharge.        Left eye: No discharge.     Conjunctiva/sclera: Conjunctivae normal.  Cardiovascular:     Rate and Rhythm: Normal rate and regular rhythm.     Heart sounds: No murmur.  Pulmonary:     Effort: Pulmonary effort is normal.     Breath sounds: Normal breath sounds. No wheezing, rhonchi or rales.  Neurological:     Mental Status: She is alert.  Psychiatric:        Mood and Affect: Mood normal.        Behavior: Behavior normal.    UC Treatments / Results  Labs (all labs ordered are listed, but only abnormal results are displayed) Labs Reviewed  NOVEL CORONAVIRUS, NAA (HOSP ORDER,  SEND-OUT TO REF LAB; TAT 18-24 HRS)    EKG   Radiology No results found.  Procedures Procedures (including critical care time)  Medications Ordered in UC Medications - No data to display  Initial Impression / Assessment and Plan / UC Course  I have reviewed the triage vital signs and the nursing notes.  Pertinent labs & imaging results that were available during my care of the patient were reviewed by me and considered in my medical decision making (see chart for details).    43 year old female presents with suspected COVID-19.  Awaiting test results.  Advised supportive care and over-the-counter treatments.  Stay home.  Work note given.  Final Clinical Impressions(s) / UC Diagnoses   Final diagnoses:  Suspected COVID-19 virus infection     Discharge Instructions     Results available in 24 to 48 hours.  Stay home.  Take care  Dr. Adriana Simas     ED Prescriptions    None     PDMP not reviewed this encounter.   Tommie Sams, [place] 11/04/19 1058

## 2019-11-05 LAB — NOVEL CORONAVIRUS, NAA (HOSP ORDER, SEND-OUT TO REF LAB; TAT 18-24 HRS): SARS-CoV-2, NAA: NOT DETECTED

## 2022-04-25 ENCOUNTER — Ambulatory Visit: Payer: No Typology Code available for payment source | Admitting: Nurse Practitioner

## 2022-05-01 ENCOUNTER — Ambulatory Visit: Payer: No Typology Code available for payment source | Admitting: Unknown Physician Specialty

## 2022-05-25 ENCOUNTER — Ambulatory Visit: Payer: No Typology Code available for payment source | Admitting: Nurse Practitioner

## 2022-07-30 ENCOUNTER — Ambulatory Visit: Payer: Self-pay | Admitting: Nurse Practitioner

## 2023-08-23 ENCOUNTER — Ambulatory Visit (INDEPENDENT_AMBULATORY_CARE_PROVIDER_SITE_OTHER): Payer: BC Managed Care – PPO

## 2023-08-23 ENCOUNTER — Encounter: Payer: Self-pay | Admitting: Emergency Medicine

## 2023-08-23 ENCOUNTER — Ambulatory Visit
Admission: EM | Admit: 2023-08-23 | Discharge: 2023-08-23 | Disposition: A | Discharge status: Home / Self Care | Payer: BC Managed Care – PPO | Attending: Internal Medicine | Admitting: Internal Medicine

## 2023-08-23 DIAGNOSIS — R059 Cough, unspecified: Secondary | ICD-10-CM

## 2023-08-23 DIAGNOSIS — B9689 Other specified bacterial agents as the cause of diseases classified elsewhere: Secondary | ICD-10-CM | POA: Diagnosis not present

## 2023-08-23 DIAGNOSIS — R0989 Other specified symptoms and signs involving the circulatory and respiratory systems: Secondary | ICD-10-CM | POA: Diagnosis not present

## 2023-08-23 DIAGNOSIS — R062 Wheezing: Secondary | ICD-10-CM | POA: Diagnosis not present

## 2023-08-23 DIAGNOSIS — J208 Acute bronchitis due to other specified organisms: Secondary | ICD-10-CM

## 2023-08-23 MED ORDER — ALBUTEROL SULFATE HFA 108 (90 BASE) MCG/ACT IN AERS: 1.0000 | INHALATION_SPRAY | Freq: Four times a day (QID) | RESPIRATORY_TRACT | 0 refills | Status: DC | PRN

## 2023-08-23 MED ORDER — PROMETHAZINE-DM 6.25-15 MG/5ML PO SYRP: 5.0000 mL | ORAL_SOLUTION | Freq: Four times a day (QID) | ORAL | 0 refills | Status: DC | PRN

## 2023-08-23 MED ORDER — AZITHROMYCIN 250 MG PO TABS: 250.0000 mg | ORAL_TABLET | Freq: Every day | ORAL | 0 refills | Status: DC

## 2023-08-23 MED ORDER — IPRATROPIUM-ALBUTEROL 0.5-2.5 (3) MG/3ML IN SOLN
3.0000 mL | Freq: Once | RESPIRATORY_TRACT | Status: AC
  Administered 2023-08-23: 3 mL via RESPIRATORY_TRACT

## 2023-08-23 NOTE — ED Provider Notes (Signed)
[facility]    CSN: [id number] Arrival date & time: 08/23/23  1528      History   Chief Complaint Chief Complaint  Patient presents with   Cough    HPI Alyssa Cole is a 47 y.o. female  presents for evaluation of URI symptoms for 30 days. Patient reports associated symptoms of cough congestion with wheezing and shortness of breath. Denies N/V/D, fevers, ear pain, sore throat, body aches. Patient does not have a hx of asthma. Patient is an active smoker.  Reports sick contacts via family members.  Pt has taken NQ OTC for symptoms. Pt has no other concerns at this time.    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 No pertinent surgical history.  OB History   No obstetric history on file.      Home Medications    Prior to Admission medications   Medication Sig Start Date End Date Taking? Authorizing Provider  albuterol (VENTOLIN HFA) 108 (90 Base) MCG/ACT inhaler Inhale 1-2 puffs into the lungs every 6 (six) hours as needed for wheezing or shortness of breath. 08/23/23  Yes Radford Pax, NP  azithromycin (ZITHROMAX) 250 MG tablet Take 1 tablet (250 mg total) by mouth daily. Take first 2 tablets together, then 1 every day until finished. 08/23/23  Yes Radford Pax, NP  promethazine-dextromethorphan (PROMETHAZINE-DM) 6.25-15 MG/5ML syrup Take 5 mLs by mouth 4 (four) times daily as needed for cough. 08/23/23  Yes Radford Pax, NP  lamoTRIgine (LAMICTAL) 100 MG tablet Take 100 mg by mouth at bedtime. 10/06/19   [provider]  lamoTRIgine (LAMICTAL) 25 MG tablet Take 75 mg by mouth at bedtime. 09/29/19   [provider]   traZODone (DESYREL) 50 MG tablet Take 50 mg by mouth every evening. 10/06/19   [provider]    Family History Family History  Problem Relation Age of Onset   Hypertension Mother    Hypertension Father     Social History Social History   Tobacco Use   Smoking status: Former    Current packs/day: 1.00    Average packs/day: 1 pack/day for 15.0 years (15.0 ttl pk-yrs)    Types: Cigarettes   Smokeless tobacco: Never  Vaping Use   Vaping status: Every Day  Substance Use Topics   Alcohol use: Not Currently   Drug use: Never     Allergies   Patient has no known allergies.   Review of Systems Review of Systems  HENT:  Positive for congestion.   Respiratory:  Positive for cough and wheezing.      Physical Exam Triage Vital Signs ED Triage Vitals  Encounter Vitals Group     BP 08/23/23 1540 (!) 153/100     Systolic BP Percentile --      Diastolic BP Percentile --      Pulse Rate 08/23/23 1540 92     Resp 08/23/23 1540 14     Temp 08/23/23 1540 98 F (36.7 C)     Temp Source 08/23/23 1540 Oral     SpO2 08/23/23 1540 98 %     Weight 08/23/23 1538 175 lb (79.4 kg)  Height 08/23/23 1538 5\' 3"  (1.6 m)     Head Circumference --      Peak Flow --      Pain Score 08/23/23 1538 7     Pain Loc --      Pain Education --      Exclude from Growth Chart --    No data found.  Updated Vital Signs BP (!) 153/100 (BP Location: Left Arm)   Pulse 92   Temp 98 F (36.7 C) (Oral)   Resp 14   Ht 5\' 3"  (1.6 m)   Wt 175 lb (79.4 kg)   LMP 08/22/2023 (Exact Date)   SpO2 98%   BMI 31.00 kg/m   Visual Acuity Right Eye Distance:   Left Eye Distance:   Bilateral Distance:    Right Eye Near:   Left Eye Near:    Bilateral Near:     Physical Exam   UC Treatments / Results  Labs (all labs ordered are listed, but only abnormal results are displayed) Labs Reviewed - No data to display  EKG   Radiology No results found.  Procedures Procedures  (including critical care time)  Medications Ordered in UC Medications  ipratropium-albuterol (DUONEB) 0.5-2.5 (3) MG/3ML nebulizer solution 3 mL (3 mLs Nebulization Given 08/23/23 1631)    Initial Impression / Assessment and Plan / UC Course  I have reviewed the triage vital signs and the nursing notes.  Pertinent labs & imaging results that were available during my care of the patient were reviewed by me and considered in my medical decision making (see chart for details).     Wheezing improved after nebulizer. Given LOS, start Zithromax. CXR wet read without consolidation.  Will contact for any positive results based on radiology overread.  Albuterol inhaler PRN.  Promethazine DM as needed for cough.  Side effect profile reviewed.  PCP follow-up 2 to 3 days for recheck.  ER precautions reviewed. Final Clinical Impressions(s) / UC Diagnoses   Final diagnoses:  Wheezing  Acute bacterial bronchitis     Discharge Instructions      Start zithromax as prescribed. Albuterol inhaler as needed. Promethazine DM as needed for cough. Please note this medication can make you drowsy. Do not drink alcohol or drive while on this medication. Lots of rest and fluids. Follow up with your PCP in 2-3 days for recheck. Please go to the ER for any worsening symptoms. I hope you feel better soon!     ED Prescriptions     Medication Sig Dispense Auth. Provider   azithromycin (ZITHROMAX) 250 MG tablet Take 1 tablet (250 mg total) by mouth daily. Take first 2 tablets together, then 1 every day until finished. 6 tablet Radford Pax, NP   promethazine-dextromethorphan (PROMETHAZINE-DM) 6.25-15 MG/5ML syrup Take 5 mLs by mouth 4 (four) times daily as needed for cough. 118 mL Radford Pax, NP   albuterol (VENTOLIN HFA) 108 (90 Base) MCG/ACT inhaler Inhale 1-2 puffs into the lungs every 6 (six) hours as needed for wheezing or shortness of breath. 1 each Radford Pax, NP      PDMP not reviewed this  encounter.   Radford Pax, NP 08/23/23 [phone]

## 2023-08-23 NOTE — Discharge Instructions (Addendum)
Start zithromax as prescribed. Albuterol inhaler as needed. Promethazine DM as needed for cough. Please note this medication can make you drowsy. Do not drink alcohol or drive while on this medication. Lots of rest and fluids. Follow up with your PCP in 2-3 days for recheck. Please go to the ER for any worsening symptoms. I hope you feel better soon!

## 2023-08-23 NOTE — ED Triage Notes (Signed)
Patient reports ongoing cough and chest congestion for a month.  Patient unsure of fevers. Patient reports SOB at night.

## 2023-09-05 DIAGNOSIS — F32A Depression, unspecified: Secondary | ICD-10-CM | POA: Diagnosis not present

## 2023-09-05 DIAGNOSIS — E8881 Metabolic syndrome: Secondary | ICD-10-CM | POA: Diagnosis not present

## 2023-09-05 DIAGNOSIS — Z713 Dietary counseling and surveillance: Secondary | ICD-10-CM | POA: Diagnosis not present

## 2023-09-05 DIAGNOSIS — Z6831 Body mass index (BMI) 31.0-31.9, adult: Secondary | ICD-10-CM | POA: Diagnosis not present

## 2023-09-05 DIAGNOSIS — E559 Vitamin D deficiency, unspecified: Secondary | ICD-10-CM | POA: Diagnosis not present

## 2023-09-05 DIAGNOSIS — F419 Anxiety disorder, unspecified: Secondary | ICD-10-CM | POA: Diagnosis not present

## 2023-09-20 ENCOUNTER — Encounter: Payer: Self-pay | Admitting: Pediatrics

## 2023-09-20 ENCOUNTER — Ambulatory Visit (INDEPENDENT_AMBULATORY_CARE_PROVIDER_SITE_OTHER): Payer: BC Managed Care – PPO | Admitting: Pediatrics

## 2023-09-20 VITALS — BP 135/83 | HR 94 | Temp 98.6°F | Ht 63.8 in | Wt 175.6 lb

## 2023-09-20 DIAGNOSIS — R5383 Other fatigue: Secondary | ICD-10-CM | POA: Diagnosis not present

## 2023-09-20 DIAGNOSIS — Z716 Tobacco abuse counseling: Secondary | ICD-10-CM | POA: Diagnosis not present

## 2023-09-20 DIAGNOSIS — N951 Menopausal and female climacteric states: Secondary | ICD-10-CM

## 2023-09-20 DIAGNOSIS — Z6830 Body mass index (BMI) 30.0-30.9, adult: Secondary | ICD-10-CM | POA: Diagnosis not present

## 2023-09-20 DIAGNOSIS — F17210 Nicotine dependence, cigarettes, uncomplicated: Secondary | ICD-10-CM | POA: Diagnosis not present

## 2023-09-20 DIAGNOSIS — G4700 Insomnia, unspecified: Secondary | ICD-10-CM | POA: Diagnosis not present

## 2023-09-20 DIAGNOSIS — Z6826 Body mass index (BMI) 26.0-26.9, adult: Secondary | ICD-10-CM | POA: Insufficient documentation

## 2023-09-20 DIAGNOSIS — Z7689 Persons encountering health services in other specified circumstances: Secondary | ICD-10-CM | POA: Diagnosis not present

## 2023-09-20 DIAGNOSIS — R0602 Shortness of breath: Secondary | ICD-10-CM

## 2023-09-20 DIAGNOSIS — Z1330 Encounter for screening examination for mental health and behavioral disorders, unspecified: Secondary | ICD-10-CM

## 2023-09-20 MED ORDER — TOPIRAMATE 25 MG PO TABS: 25.0000 mg | ORAL_TABLET | Freq: Every day | ORAL | 0 refills | Status: DC

## 2023-09-20 MED ORDER — TRAZODONE HCL 50 MG PO TABS: 25.0000 mg | ORAL_TABLET | Freq: Every evening | ORAL | 3 refills | Status: DC | PRN

## 2023-09-20 MED ORDER — VARENICLINE TARTRATE 0.5 MG PO TABS: ORAL_TABLET | ORAL | 1 refills | Status: DC

## 2023-09-20 MED ORDER — NICOTINE 21 MG/24HR TD PT24: 21.0000 mg | MEDICATED_PATCH | Freq: Every day | TRANSDERMAL | 0 refills | Status: AC

## 2023-09-20 NOTE — Progress Notes (Signed)
Establish Care Note  BP 135/83   Pulse 94   Temp 98.6 F (37 C) (Oral)   Ht 5' 3.8" (1.621 m)   Wt 175 lb 9.6 oz (79.7 kg)   LMP 08/22/2023 (Exact Date)   SpO2 98%   BMI 30.33 kg/m    Subjective:    Patient ID: Alyssa Cole, female    DOB: 10-18-1976, 47 y.o.   MRN: [id number]  HPI: Alyssa Cole is a 47 y.o. female  Chief Complaint  Patient presents with   Nicotine Dependence    Patient states she would like to discuss stopping smoking. States she would like to discuss starting on Chantix   Thyroid Problem    Patient states she would like to have her thyroid checked. States she has been feeling very fatigued and had some weight gain. States some people in her family have had thyroid issues.    Premenopause    Patient states she has been having symptoms of being in premenopause. States her periods have become irregular, has had mood swings, and had hot flashes.     Establishing care, the following was discussed today:  Discussed the use of AI scribe software for clinical note transcription with the patient, who gave verbal consent to proceed.  History of Present Illness   The patient, previously under the care of [facility], presents after a gap of approximately ten years. She reports a history of intermittent depressive symptoms, but denies any chronic medical conditions or medications, aside from fiber and prebiotics. The patient is a long-term smoker, having started at the age of 23, and currently smokes a pack a day. She expresses a strong desire to quit and has previously tried patches, gum, Wellbutrin, and Chantix. The patient reports a constant wheezing and shortness of breath, which is particularly noticeable when walking short distances.  The patient also reports experiencing fatigue, which she attributes to the recent passing of her mother and the onset of what she believes to be perimenopause. She describes experiencing hot flashes, cold spells, heart  palpitations, and sleep disturbances. The patient has been using Unisom to aid sleep but often wakes up around three in the morning. She has previously been prescribed Trazodone for sleep, which she found effective. The patient also reports a weight gain of fifteen pounds over the past year, despite maintaining a diet rich in vegetables. She expresses a desire to return to her goal weight of 150 pounds.  In the past, the patient was seen by a psychologist following a suicide attempt in September 2020. She was prescribed Lamictal and Trazodone, but has since weaned herself off these medications. The patient reports that her mood has been stable, with no significant swings, and she attributes this stability to her strong religious faith and involvement in church activities. The patient's father has a history of thyroid issues, and the patient's mother was diagnosed with diabetes shortly before her death.      #HM Immunizations: up to date and documented, declines flu and covid  Relevant past medical, surgical, family and social history reviewed and updated as indicated. Interim medical history since our last visit reviewed. Allergies and medications reviewed and updated.  ROS per HPI unless specifically indicated above     Objective:    BP 135/83   Pulse 94   Temp 98.6 F (37 C) (Oral)   Ht 5' 3.8" (1.621 m)   Wt 175 lb 9.6 oz (79.7 kg)   LMP 08/22/2023 (Exact Date)  SpO2 98%   BMI 30.33 kg/m   Wt Readings from Last 3 Encounters:  09/20/23 175 lb 9.6 oz (79.7 kg)  08/23/23 175 lb (79.4 kg)  11/04/19 150 lb (68 kg)     Physical Exam Constitutional:      Appearance: Normal appearance.  HENT:     Head: Normocephalic and atraumatic.  Eyes:     Pupils: Pupils are equal, round, and reactive to light.  Cardiovascular:     Rate and Rhythm: Normal rate and regular rhythm.     Pulses: Normal pulses.     Heart sounds: Normal heart sounds.  Pulmonary:     Effort: Pulmonary effort is  normal.     Breath sounds: Wheezing present.     Comments: Expiratory wheezing in upper lung fields bilaterally, cleared with deep breathing. Musculoskeletal:        General: Normal range of motion.     Cervical back: Normal range of motion.  Skin:    General: Skin is warm and dry.     Capillary Refill: Capillary refill takes less than 2 seconds.  Neurological:     General: No focal deficit present.     Mental Status: She is alert. Mental status is at baseline.  Psychiatric:        Mood and Affect: Mood normal.        Behavior: Behavior normal.         09/20/2023   10:07 AM  Depression screen PHQ 2/9  Decreased Interest 1  Down, Depressed, Hopeless 1  PHQ - 2 Score 2  Altered sleeping 2  Tired, decreased energy 2  Change in appetite 1  Feeling bad or failure about yourself  1  Trouble concentrating 1  Moving slowly or fidgety/restless 0  Suicidal thoughts 0  PHQ-9 Score 9  Difficult doing work/chores Somewhat difficult        09/20/2023   10:08 AM  GAD 7 : Generalized Anxiety Score  Nervous, Anxious, on Edge 1  Control/stop worrying 1  Worry too much - different things 1  Trouble relaxing 1  Restless 0  Easily annoyed or irritable 1  Afraid - awful might happen 0  Total GAD 7 Score 5  Anxiety Difficulty Somewhat difficult       Assessment & Plan:  Assessment & Plan   Encounter to establish care Reviewed patient record including history, medications, problem list. HM updated as able. Will bring records and will fill HM gaps as needed at follow up visit.  Cigarette nicotine dependence without complication Tobacco abuse counseling Assessment & Plan: Discussed cessation and available treatment including nicotine replacement options, pharmacologic treatment, and/or online resources. Based on our discussion, she does plan to initiate treatment today. Plans to initiate tx w/ Varenicline (Chantix) and nicotine patches.  Orders: -     Nicotine; Place 1 patch (21  mg total) onto the skin daily for 21 days.  Dispense: 21 patch; Refill: 0 -     Varenicline Tartrate; Take 1 pill for 3 days, then 2 pills for 4 days. Take 2 pills for 3 weeks. After that, can take 2 pills twice daily (total 4 pills) a day.  Dispense: 100 tablet; Refill: 1  Encounter for weight management BMI 30.0-30.9,adult Assessment & Plan: Reports weight gain of 15 pounds this year. Goal weight of 150 pounds. Discussed potential benefits of Wellbutrin/Naltrexone combination for appetite suppression, and Topiramate for nighttime cravings. -Start Topiramate for nighttime cravings. -Consider Wellbutrin/Naltrexone combination if Topiramate is not effective. -Plan  follow-up in 3-4 weeks to assess response to treatment and adjust as needed.  Orders: -     Topiramate; Take 1 tablet (25 mg total) by mouth daily.  Dispense: 30 tablet; Refill: 0  Insomnia, unspecified type Assessment & Plan: Reports intermittent "down days". Past history of depression and suicide attempt in 2020. Previously treated with Lamictal and Trazodone. No h/o mania, but suspect had hypomanic episodes in the past. Feels stable without any medications at this time. Will treat insomnia alone. -Restarting Trazodone for sleep as needed. -Monitor for mood swings with initiation of serotonin medications if started for hot flashes  Orders: -     traZODone HCl; Take 0.5-1 tablets (25-50 mg total) by mouth at bedtime as needed for sleep.  Dispense: 30 tablet; Refill: 3  Perimenopause Other fatigue Reports hot flashes, night sweats, and irregular menses. No vaginal dryness or libido changes noted. Discussed potential benefits of serotonin medications for hot flash management, but also potential for mood swings given past borderline bipolar diagnosis. Unclear etiology of fatigue at this time, though suspect multifactorial. -Check blood work below -     Hemoglobin A1c -     VITAMIN D 25 Hydroxy (Vit-D Deficiency, Fractures) -      Comprehensive metabolic panel -     Vitamin B12 -     Folate -     TSH -     Iron, TIBC and Ferritin Panel -     CBC with Differential/Platelet  SOB (shortness of breath) Chronic Obstructive Pulmonary Disease (COPD) Reports constant wheezing, shortness of breath with minimal exertion, and history of smoking. Previous mention of possible COPD during an urgent care visit for pneumonia. Physical exam reveals wheezing. -Plan in-office breathing test at follow-up visit to confirm diagnosis. -Consider inhaler therapy if COPD confirmed.  Encounter for behavioral health screening As part of their intake evaluation, the patient was screened for depression, anxiety.  PHQ9 SCORE 9, GAD7 SCORE 5. Screening results negative for tested conditions. See plan under problem/diagnosis above.   Follow up plan: Return in about 3 weeks (around 10/11/2023) for Physical, Mood.  Reco Shonk Howell Pringle, MD

## 2023-09-20 NOTE — Assessment & Plan Note (Signed)
Reports intermittent "down days". Past history of depression and suicide attempt in 2020. Previously treated with Lamictal and Trazodone. No h/o mania, but suspect had hypomanic episodes in the past. Feels stable without any medications at this time. Will treat insomnia alone. -Restarting Trazodone for sleep as needed. -Monitor for mood swings with initiation of serotonin medications if started for hot flashes

## 2023-09-20 NOTE — Patient Instructions (Addendum)
For sleep: - trazodone as needed (1/2 pill or pill)  For fatigue: - will check labs today  For smoking cessation: - sending nicotine patches and chantix - first patch is 21 mg, then 14mg , then 7mg . Each patch is one month at a time  For chantix: Initial: Days 1 to 3: 0.5 mg once daily. Days 4 to 7: 0.5 mg twice daily. Maintenance (day 8 and later): 1 mg twice daily  Duration: Continue maintenance dose for at least 11 weeks (for a total of at least 12 weeks of treatment).   Good to meet you! Welcome to [facility]!  As your primary care doctor, I look forward to working with you to help you reach your health goals.  Please be aware of a couple of logistical items: - If you message me on mychart, it may take me 1-2 business days to get back to you. This is for non-urgent messaging.  - If you require urgent clinical attention, please call the clinic or present to urgent care/emergency room - If you have labs, I typically will send a message about them in 1-2 business days. - I am not here on Mondays, otherwise will be available from Tuesday-Friday during 8a-5pm.   For weight management: - daily 25mg  topiramate. This will help with night time cravings. You can take it with dinner every night. This is a low dose and may need to go up to be effective. - this may also help with hot flashes, but we will reassess in a few weeks

## 2023-09-20 NOTE — Assessment & Plan Note (Signed)
Discussed cessation and available treatment including nicotine replacement options, pharmacologic treatment, and/or online resources. Based on our discussion, she does plan to initiate treatment today. Plans to initiate tx w/ Varenicline (Chantix) and nicotine patches.

## 2023-09-20 NOTE — Assessment & Plan Note (Signed)
Reports weight gain of 15 pounds this year. Goal weight of 150 pounds. Discussed potential benefits of Wellbutrin/Naltrexone combination for appetite suppression, and Topiramate for nighttime cravings. -Start Topiramate for nighttime cravings. -Consider Wellbutrin/Naltrexone combination if Topiramate is not effective. -Plan follow-up in 3-4 weeks to assess response to treatment and adjust as needed.

## 2023-09-21 LAB — COMPREHENSIVE METABOLIC PANEL
ALT: 16 [IU]/L (ref 0–32)
AST: 18 [IU]/L (ref 0–40)
Albumin: 4.3 g/dL (ref 3.9–4.9)
Alkaline Phosphatase: 48 [IU]/L (ref 44–121)
BUN/Creatinine Ratio: 27 — ABNORMAL HIGH (ref 9–23)
BUN: 18 mg/dL (ref 6–24)
Bilirubin Total: 0.2 mg/dL (ref 0.0–1.2)
CO2: 20 mmol/L (ref 20–29)
Calcium: 9.7 mg/dL (ref 8.7–10.2)
Chloride: 102 mmol/L (ref 96–106)
Creatinine, Ser: 0.67 mg/dL (ref 0.57–1.00)
Globulin, Total: 2.5 g/dL (ref 1.5–4.5)
Glucose: 87 mg/dL (ref 70–99)
Potassium: 4.5 mmol/L (ref 3.5–5.2)
Sodium: 136 mmol/L (ref 134–144)
Total Protein: 6.8 g/dL (ref 6.0–8.5)
eGFR: 108 mL/min/{1.73_m2} (ref >59)

## 2023-09-21 LAB — IRON,TIBC AND FERRITIN PANEL
Ferritin: 118 ng/mL (ref 15–150)
Iron Saturation: 14 % — ABNORMAL LOW (ref 15–55)
Iron: 46 ug/dL (ref 27–159)
Total Iron Binding Capacity: 330 ug/dL (ref 250–450)
UIBC: 284 ug/dL (ref 131–425)

## 2023-09-21 LAB — CBC WITH DIFFERENTIAL/PLATELET
Basophils Absolute: 0 10*3/uL (ref 0.0–0.2)
Basos: 1 %
EOS (ABSOLUTE): 0.5 10*3/uL — ABNORMAL HIGH (ref 0.0–0.4)
Eos: 6 %
Hematocrit: 41.7 % (ref 34.0–46.6)
Hemoglobin: 14 g/dL (ref 11.1–15.9)
Immature Grans (Abs): 0 10*3/uL (ref 0.0–0.1)
Immature Granulocytes: 0 %
Lymphocytes Absolute: 3.1 10*3/uL (ref 0.7–3.1)
Lymphs: 38 %
MCH: 32.1 pg (ref 26.6–33.0)
MCHC: 33.6 g/dL (ref 31.5–35.7)
MCV: 96 fL (ref 79–97)
Monocytes Absolute: 0.5 10*3/uL (ref 0.1–0.9)
Monocytes: 7 %
Neutrophils Absolute: 3.9 10*3/uL (ref 1.4–7.0)
Neutrophils: 48 %
Platelets: 309 10*3/uL (ref 150–450)
RBC: 4.36 x10E6/uL (ref 3.77–5.28)
RDW: 11.9 % (ref 11.7–15.4)
WBC: 8.1 10*3/uL (ref 3.4–10.8)

## 2023-09-21 LAB — HEMOGLOBIN A1C
Est. average glucose Bld gHb Est-mCnc: 117 mg/dL
Hgb A1c MFr Bld: 5.7 % — ABNORMAL HIGH (ref 4.8–5.6)

## 2023-09-21 LAB — TSH: TSH: 1.28 u[IU]/mL (ref 0.450–4.500)

## 2023-09-21 LAB — VITAMIN B12: Vitamin B-12: 294 pg/mL (ref 232–1245)

## 2023-09-21 LAB — VITAMIN D 25 HYDROXY (VIT D DEFICIENCY, FRACTURES): Vit D, 25-Hydroxy: 30.2 ng/mL (ref 30.0–100.0)

## 2023-09-21 LAB — FOLATE: Folate: 5.1 ng/mL (ref >3.0)

## 2023-09-23 ENCOUNTER — Ambulatory Visit: Payer: Self-pay

## 2023-09-23 NOTE — Telephone Encounter (Signed)
Chief Complaint: Information only  Disposition: [] ED /[] Urgent Care (no appt availability in office) / [] Appointment(In office/virtual)/ []  [facility]/ [x] Home Care/ [] Refused Recommended Disposition /[] [facility]/ []  Follow-up with PCP Additional Notes: Patient wanted to let PCP know the pharmacy is requesting clarification on the  Chantix Rx. Advised patient that the pharmacy did fax a request over to the office and we also received her MyChart message. PCP assistant is aware and forwarded the pharmacy request to provider. Awaiting provider response at this time.   Summary: directions for hantix   Pt called in about Chantix, pharmacy needs directions for it , to be filled.     Reason for Disposition  Health Information question, no triage required and triager able to answer question  Answer Assessment - Initial Assessment Questions 1. REASON FOR CALL or QUESTION: "What is your reason for calling today?" or "How can I best help you?" or "What question do you have that I can help answer?"     Just wanted to let my PCP know the pharmacy needs clarification to fill the Chantix Rx. I was able to pick-up the other medications.  Protocols used: Information Only Call - No Triage-A-AH

## 2023-09-25 ENCOUNTER — Other Ambulatory Visit: Payer: Self-pay | Admitting: Pediatrics

## 2023-09-25 DIAGNOSIS — F17210 Nicotine dependence, cigarettes, uncomplicated: Secondary | ICD-10-CM

## 2023-09-25 MED ORDER — VARENICLINE TARTRATE 0.5 MG PO TABS: ORAL_TABLET | ORAL | 1 refills | Status: DC

## 2023-09-25 NOTE — Telephone Encounter (Signed)
Patient called to get an update on her varenicline (CHANTIX) 0.5 MG tablet. Last message was from pharmacy needing clarification on directions. Please advise.

## 2023-09-25 NOTE — Progress Notes (Signed)
Sent updated pharmacy instructions  Tonna Palazzi Howell Pringle, MD

## 2023-10-14 ENCOUNTER — Encounter: Payer: Self-pay | Admitting: Emergency Medicine

## 2023-10-14 ENCOUNTER — Other Ambulatory Visit: Payer: Self-pay

## 2023-10-14 ENCOUNTER — Emergency Department: Payer: BC Managed Care – PPO

## 2023-10-14 ENCOUNTER — Emergency Department
Admission: EM | Admit: 2023-10-14 | Discharge: 2023-10-14 | Disposition: A | Discharge status: Home / Self Care | Payer: BC Managed Care – PPO | Attending: Emergency Medicine | Admitting: Emergency Medicine

## 2023-10-14 DIAGNOSIS — K2900 Acute gastritis without bleeding: Secondary | ICD-10-CM | POA: Insufficient documentation

## 2023-10-14 DIAGNOSIS — R1012 Left upper quadrant pain: Secondary | ICD-10-CM | POA: Diagnosis not present

## 2023-10-14 DIAGNOSIS — K5730 Diverticulosis of large intestine without perforation or abscess without bleeding: Secondary | ICD-10-CM | POA: Diagnosis not present

## 2023-10-14 DIAGNOSIS — R9431 Abnormal electrocardiogram [ECG] [EKG]: Secondary | ICD-10-CM | POA: Diagnosis not present

## 2023-10-14 DIAGNOSIS — R1010 Upper abdominal pain, unspecified: Secondary | ICD-10-CM | POA: Diagnosis not present

## 2023-10-14 LAB — URINALYSIS, ROUTINE W REFLEX MICROSCOPIC
Bilirubin Urine: NEGATIVE
Glucose, UA: NEGATIVE mg/dL
Hgb urine dipstick: NEGATIVE
Ketones, ur: NEGATIVE mg/dL
Leukocytes,Ua: NEGATIVE
Nitrite: NEGATIVE
Protein, ur: NEGATIVE mg/dL
Specific Gravity, Urine: 1.02 (ref 1.005–1.030)
pH: 7 (ref 5.0–8.0)

## 2023-10-14 LAB — CBC
HCT: 39 % (ref 36.0–46.0)
Hemoglobin: 13.3 g/dL (ref 12.0–15.0)
MCH: 32 pg (ref 26.0–34.0)
MCHC: 34.1 g/dL (ref 30.0–36.0)
MCV: 93.8 fL (ref 80.0–100.0)
Platelets: 262 10*3/uL (ref 150–400)
RBC: 4.16 MIL/uL (ref 3.87–5.11)
RDW: 12.4 % (ref 11.5–15.5)
WBC: 7.2 10*3/uL (ref 4.0–10.5)
nRBC: 0 % (ref 0.0–0.2)

## 2023-10-14 LAB — COMPREHENSIVE METABOLIC PANEL
ALT: 16 U/L (ref 0–44)
AST: 17 U/L (ref 15–41)
Albumin: 3.8 g/dL (ref 3.5–5.0)
Alkaline Phosphatase: 37 U/L — ABNORMAL LOW (ref 38–126)
Anion gap: 9 (ref 5–15)
BUN: 13 mg/dL (ref 6–20)
CO2: 18 mmol/L — ABNORMAL LOW (ref 22–32)
Calcium: 8.7 mg/dL — ABNORMAL LOW (ref 8.9–10.3)
Chloride: 107 mmol/L (ref 98–111)
Creatinine, Ser: 0.62 mg/dL (ref 0.44–1.00)
GFR, Estimated: >60 mL/min (ref >60)
Glucose, Bld: 120 mg/dL — ABNORMAL HIGH (ref 70–99)
Potassium: 3.8 mmol/L (ref 3.5–5.1)
Sodium: 134 mmol/L — ABNORMAL LOW (ref 135–145)
Total Bilirubin: 0.5 mg/dL (ref <1.2)
Total Protein: 6.7 g/dL (ref 6.5–8.1)

## 2023-10-14 LAB — POC URINE PREG, ED: Preg Test, Ur: NEGATIVE

## 2023-10-14 LAB — LIPASE, BLOOD: Lipase: 25 U/L (ref 11–51)

## 2023-10-14 MED ORDER — PANTOPRAZOLE SODIUM 40 MG PO TBEC: 40.0000 mg | DELAYED_RELEASE_TABLET | Freq: Every day | ORAL | 1 refills | Status: DC

## 2023-10-14 MED ORDER — IOHEXOL 300 MG/ML  SOLN
100.0000 mL | Freq: Once | INTRAMUSCULAR | Status: AC | PRN
  Administered 2023-10-14: 100 mL via INTRAVENOUS

## 2023-10-14 NOTE — ED Provider Notes (Signed)
[facility] Provider Note    Event Date/Time   First MD Initiated Contact with Patient 10/14/23 [phone]     (approximate)   History   Abdominal Pain   HPI  Alyssa Cole is a 47 y.o. female with a history of a C-section who presents with complaints of left upper quadrant abdominal pain with radiation to her back for over a month now.  She reports she feels bloated and constantly full.  She reports symptoms worsen when she eats, she reports decreased p.o. intake because of this and weight loss.  She also reports diarrhea     Physical Exam   Triage Vital Signs: ED Triage Vitals  Encounter Vitals Group     BP 10/14/23 0726 (!) 158/103     Systolic BP Percentile --      Diastolic BP Percentile --      Pulse Rate 10/14/23 0726 89     Resp 10/14/23 0726 18     Temp 10/14/23 0726 97.8 F (36.6 C)     Temp Source 10/14/23 0726 Oral     SpO2 10/14/23 0726 100 %     Weight 10/14/23 0727 78 kg (172 lb)     Height 10/14/23 0727 1.6 m (5\' 3" )     Head Circumference --      Peak Flow --      Pain Score 10/14/23 0726 6     Pain Loc --      Pain Education --      Exclude from Growth Chart --     Most recent vital signs: Vitals:   10/14/23 0726  BP: (!) 158/103  Pulse: 89  Resp: 18  Temp: 97.8 F (36.6 C)  SpO2: 100%     General: Awake, no distress.  CV:  Good peripheral perfusion.  Resp:  Normal effort.  Abd:  No distention.  Tenderness in the left upper quadrant primarily Other:     ED Results / Procedures / Treatments   Labs (all labs ordered are listed, but only abnormal results are displayed) Labs Reviewed  COMPREHENSIVE METABOLIC PANEL - Abnormal; Notable for the following components:      Result Value   Sodium 134 (*)    CO2 18 (*)    Glucose, Bld 120 (*)    Calcium 8.7 (*)    Alkaline Phosphatase 37 (*)    All other components within normal limits  LIPASE, BLOOD  CBC  URINALYSIS, ROUTINE W REFLEX MICROSCOPIC  POC URINE PREG,  ED     EKG  ED ECG REPORT I, Jene Every, the attending physician, personally viewed and interpreted this ECG.  Date: 10/14/2023  Rhythm: normal sinus rhythm QRS Axis: normal Intervals: normal ST/T Wave abnormalities: normal Narrative Interpretation: no evidence of acute ischemia    RADIOLOGY  CT scan viewed interpret by me, no acute abnormalities, pending radiology review   PROCEDURES:  Critical Care performed:   Procedures   MEDICATIONS ORDERED IN ED: Medications  iohexol (OMNIPAQUE) 300 MG/ML solution 100 mL (100 mLs Intravenous Contrast Given 10/14/23 0833)     IMPRESSION / MDM / ASSESSMENT AND PLAN / ED COURSE  I reviewed the triage vital signs and the nursing notes. Patient's presentation is most consistent with acute presentation with potential threat to life or bodily function.  Patient presents with abdominal pain as detailed above, differential is extensive, includes gastritis, PUD, colitis, CA  Labwork reviewed overall reassuring, LFTs normal.  Lipase normal  Will send for imaging  pending CT abdomen pelvis  CT scan without acute abnormality per radiology, will refer to GI for likely gastritis, will switch to Protonix, no indication for admission at this time, patient agrees with this plan      FINAL CLINICAL IMPRESSION(S) / ED DIAGNOSES   Final diagnoses:  Acute gastritis without hemorrhage, unspecified gastritis type     Rx / DC Orders   ED Discharge Orders          Ordered    Ambulatory referral to Gastroenterology        10/14/23 0900    pantoprazole (PROTONIX) 40 MG tablet  Daily        10/14/23 0901             Note:  This document was prepared using Dragon voice recognition software and may include unintentional dictation errors.   Jene Every, MD 10/14/23 [phone]

## 2023-10-14 NOTE — ED Triage Notes (Signed)
Patient to ED via POV for upper abd pain that radiates into back and under left breast. States ongoing x2 months with diarrhea. States she hasn't been eating as much due to pain after eating. States belly feels swollen- like a knot is in her belly.

## 2023-10-14 NOTE — ED Notes (Signed)
See triage note  Presents with abd pain   States pain started 2 months ago  Pain is worse after eating  And pain radiates into upper back and chest

## 2023-10-18 ENCOUNTER — Encounter: Payer: Self-pay | Admitting: Pediatrics

## 2023-10-18 ENCOUNTER — Ambulatory Visit (INDEPENDENT_AMBULATORY_CARE_PROVIDER_SITE_OTHER): Payer: BC Managed Care – PPO | Admitting: Pediatrics

## 2023-10-18 VITALS — BP 119/86 | HR 86 | Temp 98.4°F | Resp 14 | Ht 64.8 in | Wt 176.4 lb

## 2023-10-18 DIAGNOSIS — R053 Chronic cough: Secondary | ICD-10-CM | POA: Diagnosis not present

## 2023-10-18 DIAGNOSIS — F17210 Nicotine dependence, cigarettes, uncomplicated: Secondary | ICD-10-CM

## 2023-10-18 DIAGNOSIS — Z6830 Body mass index (BMI) 30.0-30.9, adult: Secondary | ICD-10-CM | POA: Diagnosis not present

## 2023-10-18 DIAGNOSIS — Z716 Tobacco abuse counseling: Secondary | ICD-10-CM | POA: Diagnosis not present

## 2023-10-18 DIAGNOSIS — Z0000 Encounter for general adult medical examination without abnormal findings: Secondary | ICD-10-CM

## 2023-10-18 DIAGNOSIS — R942 Abnormal results of pulmonary function studies: Secondary | ICD-10-CM | POA: Diagnosis not present

## 2023-10-18 DIAGNOSIS — Z7689 Persons encountering health services in other specified circumstances: Secondary | ICD-10-CM | POA: Diagnosis not present

## 2023-10-18 DIAGNOSIS — Z1330 Encounter for screening examination for mental health and behavioral disorders, unspecified: Secondary | ICD-10-CM

## 2023-10-18 DIAGNOSIS — K219 Gastro-esophageal reflux disease without esophagitis: Secondary | ICD-10-CM | POA: Diagnosis not present

## 2023-10-18 MED ORDER — TOPIRAMATE 25 MG PO TABS: 25.0000 mg | ORAL_TABLET | Freq: Every day | ORAL | 3 refills | Status: DC

## 2023-10-18 MED ORDER — PANTOPRAZOLE SODIUM 40 MG PO TBEC: 40.0000 mg | DELAYED_RELEASE_TABLET | Freq: Two times a day (BID) | ORAL | 1 refills | Status: DC

## 2023-10-18 NOTE — Progress Notes (Unsigned)
During provider visit provider requested testing and order placed. Reviewed instructions with patient and answered questions before proceeding.   Per provider order spirometry pre and post testing with a albuterol treatment completed. Patient completed testing without complications.

## 2023-10-18 NOTE — Progress Notes (Unsigned)
BP 119/86 (BP Location: Left Arm, Patient Position: Sitting, Cuff Size: Normal)   Pulse 86   Temp 98.4 F (36.9 C) (Oral)   Resp 14   Ht 5' 4.8" (1.646 m)   Wt 176 lb 6.4 oz (80 kg)   LMP 09/23/2023 (Exact Date)   SpO2 97%   BMI 29.53 kg/m    Annual Physical Exam - Female  Subjective:   CC: Annual Exam, Breathing Problem (Said she is to have COPD test), and Abdominal Pain (Feels like a mass or ball. Started about 2 months ago. Did go to ED recently. Radiates to back. Pain increased with food. Did get GI doc referral. )   Alyssa Cole is a 47 y.o. female patient here for a preventative health maintenance exam. Additional topics discussed include:  #abdominal pain Recently went to ER for acute gastritis, felt she had mass of ball Feels pressure in the epigastrium intermittently when she eats Has GI referral in place Given PPI in the past, requesting refills No hematemesis, melena, hematochezia Denies fevers, chills, nausea, vomiting  #SOB Has had recurrent SOB and what was thought to be COPD before No recent flare Amenable to spirometry testing Currently on chantix for smoking cessation  #weight management On topiramate 25mg  Working well, feels appetite is slowed Reduced night time snacking Wt Readings from Last 3 Encounters:  10/18/23 176 lb 6.4 oz (80 kg)  10/14/23 172 lb (78 kg)  09/20/23 175 lb 9.6 oz (79.7 kg)    Health Habits: DIET: in general, a "healthy" diet  , implemented diet changes given recent prediabetes finding EXERCISE: started 10 minute workout she found on line times/week on average DENTAL EXAM: Due EYE EXAM: Not applicable                       Relevant Gynecologic History LMP: Patient's last menstrual period was 09/23/2023 (exact date).  Menstrual Status: premenopausal, Flow regular every month without intermenstrual spotting PAP History:  No Cervical Cancer Screening results to display.  History abnormal PAP: unknown  Family history  breast, ovarian cancer: No Domestic Violence Screen, feels safe at home: Yes  family history includes Bone cancer in her maternal grandfather; Dementia in her father; Diabetes in her mother; Emphysema in her maternal grandmother; Heart attack in her mother and paternal grandfather; Hypertension in her father and mother; Tuberculosis in her paternal grandmother.  Social History   Tobacco Use   Smoking status: Former    Average packs/day: 1 pack/day for 15.0 years (15.0 ttl pk-yrs)    Types: Cigarettes    Start date: 10/07/2008   Smokeless tobacco: Never  Vaping Use   Vaping status: Never Used  Substance Use Topics   Alcohol use: Not Currently    Comment: Stopped since Thanksgiving   Drug use: Never   Social History   Social History Narrative   Not on file    Social drivers questionnaire is reviewed and is positive for: none  Depression Screening:     10/18/2023   12:04 PM 09/20/2023   10:07 AM  Depression screen PHQ 2/9  Decreased Interest 0 1  Down, Depressed, Hopeless 1 1  PHQ - 2 Score 1 2  Altered sleeping 0 2  Tired, decreased energy 1 2  Change in appetite 2 1  Feeling bad or failure about yourself  0 1  Trouble concentrating 1 1  Moving slowly or fidgety/restless 0 0  Suicidal thoughts 0 0  PHQ-9 Score 5 9  Difficult doing work/chores Not difficult at all Somewhat difficult       10/18/2023   12:04 PM 09/20/2023   10:08 AM  GAD 7 : Generalized Anxiety Score  Nervous, Anxious, on Edge 1 1  Control/stop worrying 1 1  Worry too much - different things 1 1  Trouble relaxing 1 1  Restless 1 0  Easily annoyed or irritable 1 1  Afraid - awful might happen 0 0  Total GAD 7 Score 6 5  Anxiety Difficulty Not difficult at all Somewhat difficult    Mental Health Plan: continue to monitor  Self Management Goals  Goals   None     Health Maintenance Colon Cancer Screening : Due Mammogram : Due DXA scan : Not applicable Immunizations : UTD, revisit  tdap at follow up visit  Review of Systems See HPI for relevant ROS.  Outpatient Medications Prior to Visit  Medication Sig Dispense Refill   traZODone (DESYREL) 50 MG tablet Take 0.5-1 tablets (25-50 mg total) by mouth at bedtime as needed for sleep. 30 tablet 3   varenicline (CHANTIX) 0.5 MG tablet Take 1 tab (0.5 mg) orally once daily for 3 days, then 0.5 mg (1 tab) twice daily on days 4 through 7, and then 1 mg (2 tabs) twice daily thereafter for 12 weeks of treatment. 100 tablet 1   nicotine (NICODERM CQ - DOSED IN MG/24 HOURS) 21 mg/24hr patch Place 21 mg onto the skin daily.     pantoprazole (PROTONIX) 40 MG tablet Take 1 tablet (40 mg total) by mouth daily. 30 tablet 1   topiramate (TOPAMAX) 25 MG tablet Take 1 tablet (25 mg total) by mouth daily. 30 tablet 0   No facility-administered medications prior to visit.     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  Physical Exam Constitutional:      Appearance: Normal appearance.  HENT:     Head: Normocephalic and atraumatic.  Eyes:     Pupils: Pupils are equal, round, and reactive to light.  Cardiovascular:     Rate and Rhythm: Normal rate and regular rhythm.     Pulses: Normal pulses.     Heart sounds: Normal heart sounds.  Pulmonary:     Effort: Pulmonary effort is normal.     Breath sounds: Normal breath sounds.  Abdominal:     General: Abdomen is flat. There is no  distension.     Palpations: Abdomen is soft. There is no mass.     Tenderness: There is no abdominal tenderness. There is no guarding or rebound.  Musculoskeletal:        General: Normal range of motion.     Cervical back: Normal range of motion.  Skin:    General: Skin is warm and dry.     Capillary Refill: Capillary refill takes less than 2 seconds.  Neurological:     General: No focal  deficit present.     Mental Status: She is alert. Mental status is at baseline.  Psychiatric:        Mood and Affect: Mood normal.        Behavior: Behavior normal.    Assessment and Plan:   Annual physical exam Discussed lifestyle modifications and goals including plant based eating styles (such as: Mediterranean eating style), regular exercise (at least 150 min of moderate-intensity aerobic exercise per week, given AHA workout handout), get adequate sleep, and continue working with PCP towards meeting health goals to ensure healthy aging.   Encounter for weight management BMI 30.0-30.9,adult Working well for appetite suppression. Consider increasing dose as weight stable. Refills sent as below. -     Topiramate; Take 1 tablet (25 mg total) by mouth daily.  Dispense: 90 tablet; Refill: 3  Tobacco abuse counseling Cigarette nicotine dependence without complication Continue chantix not using patches. Reviewed need to titrate dose down. Tolerating well. Will follow up at next visit. Total time spent: 3 minutes.  Gastroesophageal reflux disease without esophagitis Suspect discomfort is from GERD. Has GI visit scheduled. Plan on PPI therapy until visit. No red flag symptoms. -     Pantoprazole Sodium; Take 1 tablet (40 mg total) by mouth 2 (two) times daily before a meal.  Dispense: 30 tablet; Refill: 1  Chronic cough Restrictive pattern present on pulmonary function testing Chronic cough, smoker. Presumed COPD but found to have restrictive pattern on office spirometry. Plan on pulmonology  referral and CXR.  -     Spirometry with Graph; Future -     DG Chest 2 View; Future -     Ambulatory referral to Pulmonology  Encounter for behavioral health screening As part of their intake evaluation, the patient was screened for depression, anxiety.  PHQ9 SCORE 5, GAD7 SCORE 6. Screening results negative for tested conditions. Continue to monitor.  This plan was discussed with the patient and questions were answered. There were no further concerns.  Follow up as indicated, or sooner should any new problems arise, if conditions worsen, or if they are otherwise concerned.   See patient instructions for additional information.  Will due pap at follow up visit given time ran out today with multiple active issues listed above.  Curren Mohrmann Howell Pringle, MD  Family Medicine      Future Appointments  Date Time Provider Department Center  10/25/2023 11:00 AM Jackolyn Confer, MD [facility]  11/26/2023  8:30 AM Raechel Chute, MD [facility] None  11/28/2023  2:45 PM Toney Reil, MD [facility] None  12/27/2023  4:00 PM [facility] 3 [facility]

## 2023-10-18 NOTE — Patient Instructions (Addendum)
Plan on seeing pulmonologist and they will order a CT scan  Your findings showed a restrictive patter vs obstructive (which we see with COPD)

## 2023-10-23 ENCOUNTER — Encounter: Payer: Self-pay | Admitting: Pediatrics

## 2023-10-23 DIAGNOSIS — R053 Chronic cough: Secondary | ICD-10-CM | POA: Insufficient documentation

## 2023-10-23 DIAGNOSIS — K219 Gastro-esophageal reflux disease without esophagitis: Secondary | ICD-10-CM | POA: Insufficient documentation

## 2023-10-25 ENCOUNTER — Ambulatory Visit
Admission: RE | Admit: 2023-10-25 | Discharge: 2023-10-25 | Disposition: A | Discharge status: Home / Self Care | Payer: BC Managed Care – PPO | Source: Ambulatory Visit | Attending: Pediatrics | Admitting: Pediatrics

## 2023-10-25 ENCOUNTER — Ambulatory Visit (INDEPENDENT_AMBULATORY_CARE_PROVIDER_SITE_OTHER): Payer: BC Managed Care – PPO | Admitting: Pediatrics

## 2023-10-25 ENCOUNTER — Other Ambulatory Visit (HOSPITAL_COMMUNITY)
Admission: RE | Admit: 2023-10-25 | Discharge: 2023-10-25 | Disposition: A | Discharge status: Home / Self Care | Payer: BC Managed Care – PPO | Source: Ambulatory Visit | Attending: Pediatrics | Admitting: Pediatrics

## 2023-10-25 ENCOUNTER — Ambulatory Visit
Admission: RE | Admit: 2023-10-25 | Discharge: 2023-10-25 | Disposition: A | Discharge status: Home / Self Care | Payer: BC Managed Care – PPO | Attending: Pediatrics | Admitting: Pediatrics

## 2023-10-25 ENCOUNTER — Encounter: Payer: Self-pay | Admitting: Pediatrics

## 2023-10-25 VITALS — BP 109/77 | HR 77 | Temp 98.7°F | Resp 14 | Wt 174.6 lb

## 2023-10-25 DIAGNOSIS — R0602 Shortness of breath: Secondary | ICD-10-CM | POA: Diagnosis not present

## 2023-10-25 DIAGNOSIS — Z1330 Encounter for screening examination for mental health and behavioral disorders, unspecified: Secondary | ICD-10-CM | POA: Diagnosis not present

## 2023-10-25 DIAGNOSIS — R1013 Epigastric pain: Secondary | ICD-10-CM

## 2023-10-25 DIAGNOSIS — R053 Chronic cough: Secondary | ICD-10-CM

## 2023-10-25 DIAGNOSIS — F17210 Nicotine dependence, cigarettes, uncomplicated: Secondary | ICD-10-CM | POA: Diagnosis not present

## 2023-10-25 DIAGNOSIS — Z716 Tobacco abuse counseling: Secondary | ICD-10-CM

## 2023-10-25 DIAGNOSIS — Z124 Encounter for screening for malignant neoplasm of cervix: Secondary | ICD-10-CM | POA: Diagnosis not present

## 2023-10-25 DIAGNOSIS — R059 Cough, unspecified: Secondary | ICD-10-CM | POA: Diagnosis not present

## 2023-10-25 MED ORDER — NICOTINE 7 MG/24HR TD PT24: 7.0000 mg | MEDICATED_PATCH | Freq: Every day | TRANSDERMAL | 0 refills | Status: DC

## 2023-10-25 MED ORDER — NICOTINE 14 MG/24HR TD PT24: 14.0000 mg | MEDICATED_PATCH | Freq: Every day | TRANSDERMAL | 0 refills | Status: DC

## 2023-10-25 NOTE — Assessment & Plan Note (Addendum)
On Chantix and 21mg  nicotine patches, doing well with cessation. Main side effect has been from constipation, reviewed OTC treatment.  -Continue Chantix. -Dispense 14mg  and 7mg  nicotine patches for step-down therapy. -Consider switching to Senokot or Miralax for better relief of constipation

## 2023-10-25 NOTE — Progress Notes (Signed)
Office Visit  BP 109/77 (BP Location: Left Arm, Patient Position: Sitting, Cuff Size: Normal)   Pulse 77   Temp 98.7 F (37.1 C) (Oral)   Resp 14   Wt 174 lb 9.6 oz (79.2 kg)   LMP 09/23/2023 (Exact Date)   SpO2 96%   BMI 29.23 kg/m    Subjective:    Patient ID: Alyssa Cole, female    DOB: 06/20/1976, 47 y.o.   MRN: [id number]  HPI: Eknoor Dial is a 47 y.o. female  Chief Complaint  Patient presents with   Shortness of Breath    Same   Gynecologic Exam   GI Problem    Still same sternal pain    Discussed the use of AI scribe software for clinical note transcription with the patient, who gave verbal consent to proceed.  History of Present Illness   The patient, with a history of smoking, presents with persistent, gnawing chest pain, described as a 'rock' sensation. The pain is constant, worsens with certain positions, and radiates to the back. The patient has not noticed any relief with pantoprazole, which was initiated twice daily. The pain is also reported to worsen postprandially, possibly due to bloating, and the patient has noticed early satiety. The patient has made dietary changes, including increased salad intake, and started Chantix for smoking cessation. Has had episodes of worsened pain, woken from sleep.   The patient also reports a change in bowel habits since starting Chantix, with a decrease in frequency from three to four times daily to less regular movements. The stools were previously loose but are now harder, leading the patient to start a stool softener nightly.  The patient has upcoming appointments with Pulmonology and Gastroenterology, and a mammogram scheduled for February. A chest x-ray was ordered but not yet completed due to a misunderstanding about the need for an appointment. The patient was also due for a Pap smear.      Relevant past medical, surgical, family and social history reviewed and updated as indicated. Interim medical history since  our last visit reviewed. Allergies and medications reviewed and updated.  ROS per HPI unless specifically indicated above     Objective:    BP 109/77 (BP Location: Left Arm, Patient Position: Sitting, Cuff Size: Normal)   Pulse 77   Temp 98.7 F (37.1 C) (Oral)   Resp 14   Wt 174 lb 9.6 oz (79.2 kg)   LMP 09/23/2023 (Exact Date)   SpO2 96%   BMI 29.23 kg/m   Wt Readings from Last 3 Encounters:  10/25/23 174 lb 9.6 oz (79.2 kg)  10/18/23 176 lb 6.4 oz (80 kg)  10/14/23 172 lb (78 kg)     Physical Exam Exam conducted with a chaperone present.  Constitutional:      Appearance: Normal appearance.  Pulmonary:     Effort: Pulmonary effort is normal.  Genitourinary:    General: Normal vulva.     Exam position: Lithotomy position.     Vagina: Normal.     Cervix: Normal.  Musculoskeletal:        General: Normal range of motion.  Skin:    Comments: Normal skin color  Neurological:     General: No focal deficit present.     Mental Status: She is alert. Mental status is at baseline.  Psychiatric:        Mood and Affect: Mood normal.        Behavior: Behavior normal.  Thought Content: Thought content normal.         10/25/2023   11:03 AM 10/18/2023   12:04 PM 09/20/2023   10:07 AM  Depression screen PHQ 2/9  Decreased Interest 1 0 1  Down, Depressed, Hopeless 0 1 1  PHQ - 2 Score 1 1 2   Altered sleeping 0 0 2  Tired, decreased energy 1 1 2   Change in appetite 1 2 1   Feeling bad or failure about yourself  0 0 1  Trouble concentrating 1 1 1   Moving slowly or fidgety/restless 0 0 0  Suicidal thoughts 0 0 0  PHQ-9 Score 4 5 9   Difficult doing work/chores Not difficult at all Not difficult at all Somewhat difficult       10/25/2023   11:03 AM 10/18/2023   12:04 PM 09/20/2023   10:08 AM  GAD 7 : Generalized Anxiety Score  Nervous, Anxious, on Edge 1 1 1   Control/stop worrying 0 1 1  Worry too much - different things 0 1 1  Trouble relaxing 1 1 1    Restless 0 1 0  Easily annoyed or irritable 1 1 1   Afraid - awful might happen 0 0 0  Total GAD 7 Score 3 6 5   Anxiety Difficulty Not difficult at all Not difficult at all Somewhat difficult       Assessment & Plan:  Assessment & Plan   Epigastric pain Persistent, gnawing pain that radiates to the back, worsens with certain positions and after eating. CT scan was normal in the ED. Has been worsening and waking her up from sleep. Will try to expedite referral. -Continue Pantoprazole twice daily. -Contact GI for possible expedited appointment. -     Ambulatory referral to Gastroenterology  Chronic cough Scheduled with pulmonology. Plan on xray as previously discussed last visit. Well appearing on exam.  Tobacco abuse counseling Cigarette nicotine dependence without complication Assessment & Plan: On Chantix and 21mg  nicotine patches, doing well with cessation. Main side effect has been from constipation, reviewed OTC treatment.  -Continue Chantix. -Dispense 14mg  and 7mg  nicotine patches for step-down therapy. -Consider switching to Senokot or Miralax for better relief of constipation  Orders: -     Nicotine; Place 1 patch (14 mg total) onto the skin daily.  Dispense: 28 patch; Refill: 0 -     Nicotine; Place 1 patch (7 mg total) onto the skin daily.  Dispense: 28 patch; Refill: 0  Screening for cervical cancer Well appearing on exam. Sent swabs as below. -     Cytology - PAP  Encounter for behavioral health screening As part of their intake evaluation, the patient was screened for depression, anxiety.  PHQ9 SCORE 4, GAD7 SCORE 3. Screening results negative for tested conditions. Continue monitoring.   Follow up plan: Return in about 3 months (around 01/23/2024) for tobacco cessation.  Zanyia Silbaugh Howell Pringle, MD

## 2023-10-25 NOTE — Patient Instructions (Addendum)
[facility] [address] [place],  [place]  [address]  Senna - for constipation Miralax as well  I sent the two patches

## 2023-10-29 LAB — CYTOLOGY - PAP
Comment: NEGATIVE
Diagnosis: NEGATIVE
Diagnosis: REACTIVE
High risk HPV: NEGATIVE

## 2023-11-01 ENCOUNTER — Other Ambulatory Visit: Payer: Self-pay | Admitting: Pediatrics

## 2023-11-01 DIAGNOSIS — B3731 Acute candidiasis of vulva and vagina: Secondary | ICD-10-CM

## 2023-11-01 MED ORDER — FLUCONAZOLE 150 MG PO TABS: 150.0000 mg | ORAL_TABLET | Freq: Once | ORAL | 0 refills | Status: AC

## 2023-11-13 ENCOUNTER — Ambulatory Visit (INDEPENDENT_AMBULATORY_CARE_PROVIDER_SITE_OTHER): Payer: BC Managed Care – PPO | Admitting: Gastroenterology

## 2023-11-13 ENCOUNTER — Other Ambulatory Visit: Payer: Self-pay

## 2023-11-13 ENCOUNTER — Encounter: Payer: Self-pay | Admitting: Gastroenterology

## 2023-11-13 VITALS — BP 139/86 | HR 85 | Temp 98.1°F | Ht 64.8 in | Wt 177.1 lb

## 2023-11-13 DIAGNOSIS — R1319 Other dysphagia: Secondary | ICD-10-CM

## 2023-11-13 DIAGNOSIS — Z1211 Encounter for screening for malignant neoplasm of colon: Secondary | ICD-10-CM

## 2023-11-13 DIAGNOSIS — R140 Abdominal distension (gaseous): Secondary | ICD-10-CM

## 2023-11-13 DIAGNOSIS — R1013 Epigastric pain: Secondary | ICD-10-CM

## 2023-11-13 MED ORDER — POLYETHYLENE GLYCOL 3350 17 GM/SCOOP PO POWD: 1.0000 | Freq: Every day | ORAL | 0 refills | Status: AC

## 2023-11-13 MED ORDER — NA SULFATE-K SULFATE-MG SULF 17.5-3.13-1.6 GM/177ML PO SOLN: 354.0000 mL | Freq: Once | ORAL | 0 refills | Status: AC

## 2023-11-13 NOTE — Progress Notes (Signed)
 Corinn JONELLE Brooklyn, MD [address]  [address]  [place], [place] [address]  Main: [phone]  Fax: [phone]    Gastroenterology Consultation  Referring Provider:     Arlander Charleston, MD Primary Care Physician:  Herold Hadassah SQUIBB, MD Primary Gastroenterologist:  Dr. Corinn JONELLE Brooklyn Reason for Consultation: Epigastric pain        HPI:   Alyssa Cole is a 48 y.o. female referred by Dr. Herold, Hadassah SQUIBB, MD  for consultation & management of epigastric pain.  Patient went to the ER on 10/14/2023 secondary to 1 month history of epigastric pain, felt like ball sitting in her stomach and severe burning as well as bloating, symptoms are worse postprandial resulting in decreased p.o. intake.  She used to have loose stools while she was smoking tobacco, currently on Chantix  and nicotine  patch, quit smoking which has resulted in irregular bowel movements.  She was a stress eater when her mom passed away 1 year ago by tomorrow and gained a significant amount of weight.  She is trying to focus on her health now.  Labs including CBC, CMP, serum lipase, iron panel, TSH were normal.  CT abdomen and pelvis did not reveal any acute intra-abdominal pathology.  She has been started on Protonix  40 mg twice daily by her PCP about a month ago and notices some improvement in her symptoms. 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   Placenta previa 02/25/2013   Tobacco abuse 02/25/2013    Past Surgical History:  Procedure Laterality Date   CESAREAN SECTION     NASAL SINUS SURGERY        Current Outpatient Medications:    nicotine  (NICODERM CQ  - DOSED IN MG/24 HOURS) 14 mg/24hr patch, Place 1 patch (14 mg total) onto the skin daily., Disp: 28 patch, Rfl: 0   pantoprazole  (PROTONIX ) 40 MG tablet, Take 1 tablet (40 mg total) by mouth 2 (two) times daily before a meal., Disp: 30 tablet, Rfl: 1   polycarbophil (FIBERCON) 625 MG tablet, Take 625 mg by mouth daily., Disp: , Rfl:    topiramate  (TOPAMAX ) 25 MG tablet, Take 1 tablet (25 mg total) by mouth daily., Disp: 90 tablet, Rfl: 3   traZODone  (DESYREL ) 50 MG tablet, Take 0.5-1 tablets (25-50 mg total) by mouth at bedtime as needed for sleep., Disp: 30 tablet, Rfl: 3   varenicline  (CHANTIX ) 0.5 MG tablet, Take 1 tab (0.5 mg) orally once daily for 3 days, then 0.5 mg (1 tab) twice daily on days 4 through 7, and then 1 mg (2 tabs) twice daily thereafter for 12 weeks of treatment., Disp: 100 tablet, Rfl: 1   Na Sulfate-K Sulfate-Mg Sulf 17.5-3.13-1.6 GM/177ML SOLN, Take 354 mLs by mouth once for 1 dose., Disp: 354 mL, Rfl: 0   polyethylene glycol powder (GLYCOLAX /MIRALAX ) 17 GM/SCOOP powder, Take 255 g by mouth daily., Disp: 238 g, Rfl: 0   Family History  Problem Relation Age of Onset   Diabetes Mother    Hypertension Mother    Heart attack Mother    Hypertension Father    Dementia Father    Emphysema  Maternal Grandmother    Bone cancer Maternal Grandfather    Tuberculosis Paternal Grandmother    Heart attack Paternal Grandfather      Social History   Tobacco Use   Smoking status: Former    Average packs/day: 1 pack/day for 15.0 years (15.0 ttl pk-yrs)    Types: Cigarettes    Start date: 10/07/2008   Smokeless tobacco: Never  Vaping Use   Vaping status: Never Used  Substance Use Topics   Alcohol use: Not Currently    Comment: Stopped since Thanksgiving Will occ have a glass of wine   Drug use: Never    Allergies as of 11/13/2023   (No Known Allergies)    Review of Systems:    All systems reviewed and  negative except where noted in HPI.   Physical Exam:  BP 139/86 (BP Location: Left Arm, Patient Position: Sitting, Cuff Size: Normal)   Pulse 85   Temp 98.1 F (36.7 C) (Oral)   Ht 5' 4.8 (1.646 m)   Wt 177 lb 2 oz (80.3 kg)   LMP 09/23/2023 (Exact Date)   BMI 29.66 kg/m  Patient's last menstrual period was 09/23/2023 (exact date).  General:   Alert,  Well-developed, well-nourished, pleasant and cooperative in NAD Head:  Normocephalic and atraumatic. Eyes:  Sclera clear, no icterus.   Conjunctiva pink. Ears:  Normal auditory acuity. Nose:  No deformity, discharge, or lesions. Mouth:  No deformity or lesions,oropharynx pink & moist. Neck:  Supple; no masses or thyromegaly. Lungs:  Respirations even and unlabored.  Clear throughout to auscultation.   No wheezes, crackles, or rhonchi. No acute distress. Heart:  Regular rate and rhythm; no murmurs, clicks, rubs, or gallops. Abdomen:  Normal bowel sounds. Soft, non-tender and non-distended without masses, hepatosplenomegaly or hernias noted.  No guarding or rebound tenderness.   Rectal: Not performed Msk:  Symmetrical without gross deformities. Good, equal movement & strength bilaterally. Pulses:  Normal pulses noted. Extremities:  No clubbing or edema.  No cyanosis. Neurologic:  Alert and oriented x3;  grossly normal neurologically. Skin:  Intact without significant lesions or rashes. No jaundice. Psych:  Alert and cooperative. Normal mood and affect.  Imaging Studies: Reviewed  Assessment and Plan:   Alyssa Cole is a 48 y.o. female with 2 to 3 months history of epigastric pain, bloating consistent with dyspepsia partially relieved on Protonix  40 mg twice daily.  Cross-sectional imaging is unrevealing Recommend upper endoscopy for further evaluation Continue Protonix  current dose  Discussed about screening colonoscopy and she is agreeable   Follow up based on the above workup   Corinn JONELLE Brooklyn, MD

## 2023-11-13 NOTE — Patient Instructions (Signed)
 Take Miralax  17 grams Daily  Gastroesophageal Reflux Disease, Adult  Gastroesophageal reflux (GER) happens when acid from the stomach flows up into the tube that connects the mouth and the stomach (esophagus). Normally, food travels down the esophagus and stays in the stomach to be digested. With GER, food and stomach acid sometimes move back up into the esophagus. You may have a disease called gastroesophageal reflux disease (GERD) if the reflux: Happens often. Causes frequent or very bad symptoms. Causes problems such as damage to the esophagus. When this happens, the esophagus becomes sore and swollen. Over time, GERD can make small holes (ulcers) in the lining of the esophagus. What are the causes? This condition is caused by a problem with the muscle between the esophagus and the stomach. When this muscle is weak or not normal, it does not close properly to keep food and acid from coming back up from the stomach. The muscle can be weak because of: Tobacco use. Pregnancy. Having a certain type of hernia (hiatal hernia). Alcohol use. Certain foods and drinks, such as coffee, chocolate, onions, and peppermint. What increases the risk? Being overweight. Having a disease that affects your connective tissue. Taking NSAIDs, such a ibuprofen. What are the signs or symptoms? Heartburn. Difficult or painful swallowing. The feeling of having a lump in the throat. A bitter taste in the mouth. Bad breath. Having a lot of saliva. Having an upset or bloated stomach. Burping. Chest pain. Different conditions can cause chest pain. Make sure you see your doctor if you have chest pain. Shortness of breath or wheezing. A long-term cough or a cough at night. Wearing away of the surface of teeth (tooth enamel). Weight loss. How is this treated? Making changes to your diet. Taking medicine. Having surgery. Treatment will depend on how bad your symptoms are. Follow these instructions at  home: Eating and drinking  Follow a diet as told by your doctor. You may need to avoid foods and drinks such as: Coffee and tea, with or without caffeine. Drinks that contain alcohol. Energy drinks and sports drinks. Bubbly (carbonated) drinks or sodas. Chocolate and cocoa. Peppermint and mint flavorings. Garlic and onions. Horseradish. Spicy and acidic foods. These include peppers, chili powder, curry powder, vinegar, hot sauces, and BBQ sauce. Citrus fruit juices and citrus fruits, such as oranges, lemons, and limes. Tomato-based foods. These include red sauce, chili, salsa, and pizza with red sauce. Fried and fatty foods. These include donuts, french fries, potato chips, and high-fat dressings. High-fat meats. These include hot dogs, rib eye steak, sausage, ham, and bacon. High-fat dairy items, such as whole milk, butter, and cream cheese. Eat small meals often. Avoid eating large meals. Avoid drinking large amounts of liquid with your meals. Avoid eating meals during the 2-3 hours before bedtime. Avoid lying down right after you eat. Do not exercise right after you eat. Lifestyle  Do not smoke or use any products that contain nicotine  or tobacco. If you need help quitting, ask your doctor. Try to lower your stress. If you need help doing this, ask your doctor. If you are overweight, lose an amount of weight that is healthy for you. Ask your doctor about a safe weight loss goal. General instructions Pay attention to any changes in your symptoms. Take over-the-counter and prescription medicines only as told by your doctor. Do not take aspirin, ibuprofen, or other NSAIDs unless your doctor says it is okay. Wear loose clothes. Do not wear anything tight around your waist. Raise (elevate)  the head of your bed about 6 inches (15 cm). You may need to use a wedge to do this. Avoid bending over if this makes your symptoms worse. Keep all follow-up visits. Contact a doctor if: You have  new symptoms. You lose weight and you do not know why. You have trouble swallowing or it hurts to swallow. You have wheezing or a cough that keeps happening. You have a hoarse voice. Your symptoms do not get better with treatment. Get help right away if: You have sudden pain in your arms, neck, jaw, teeth, or back. You suddenly feel sweaty, dizzy, or light-headed. You have chest pain or shortness of breath. You vomit and the vomit is green, yellow, or black, or it looks like blood or coffee grounds. You faint. Your poop (stool) is red, bloody, or black. You cannot swallow, drink, or eat. These symptoms may represent a serious problem that is an emergency. Do not wait to see if the symptoms will go away. Get medical help right away. Call your local emergency services (911 in the [place] Do not drive yourself to the hospital. Summary If a person has gastroesophageal reflux disease (GERD), food and stomach acid move back up into the esophagus and cause symptoms or problems such as damage to the esophagus. Treatment will depend on how bad your symptoms are. Follow a diet as told by your doctor. Take all medicines only as told by your doctor. This information is not intended to replace advice given to you by your health care provider. Make sure you discuss any questions you have with your health care provider. Document Revised: 05/02/2020 Document Reviewed: 05/02/2020 Elsevier Patient Education  2024 [employer].

## 2023-11-21 ENCOUNTER — Ambulatory Visit
Admission: RE | Admit: 2023-11-21 | Discharge: 2023-11-21 | Disposition: A | Discharge status: Home / Self Care | Payer: BC Managed Care – PPO | Attending: Gastroenterology | Admitting: Gastroenterology

## 2023-11-21 ENCOUNTER — Other Ambulatory Visit: Payer: Self-pay

## 2023-11-21 ENCOUNTER — Ambulatory Visit: Payer: BC Managed Care – PPO | Admitting: Anesthesiology

## 2023-11-21 ENCOUNTER — Encounter
Admission: RE | Disposition: A | Discharge status: Home / Self Care | Payer: Self-pay | Source: Home / Self Care | Attending: Gastroenterology

## 2023-11-21 ENCOUNTER — Encounter: Payer: Self-pay | Admitting: Gastroenterology

## 2023-11-21 DIAGNOSIS — Z1211 Encounter for screening for malignant neoplasm of colon: Secondary | ICD-10-CM | POA: Diagnosis not present

## 2023-11-21 DIAGNOSIS — D124 Benign neoplasm of descending colon: Secondary | ICD-10-CM | POA: Diagnosis not present

## 2023-11-21 DIAGNOSIS — F17210 Nicotine dependence, cigarettes, uncomplicated: Secondary | ICD-10-CM

## 2023-11-21 DIAGNOSIS — Z87891 Personal history of nicotine dependence: Secondary | ICD-10-CM | POA: Insufficient documentation

## 2023-11-21 DIAGNOSIS — K2950 Unspecified chronic gastritis without bleeding: Secondary | ICD-10-CM | POA: Diagnosis not present

## 2023-11-21 DIAGNOSIS — K562 Volvulus: Secondary | ICD-10-CM | POA: Diagnosis not present

## 2023-11-21 DIAGNOSIS — K2970 Gastritis, unspecified, without bleeding: Secondary | ICD-10-CM | POA: Diagnosis not present

## 2023-11-21 DIAGNOSIS — K2289 Other specified disease of esophagus: Secondary | ICD-10-CM | POA: Diagnosis not present

## 2023-11-21 DIAGNOSIS — R1319 Other dysphagia: Secondary | ICD-10-CM

## 2023-11-21 DIAGNOSIS — K635 Polyp of colon: Secondary | ICD-10-CM | POA: Diagnosis not present

## 2023-11-21 DIAGNOSIS — K219 Gastro-esophageal reflux disease without esophagitis: Secondary | ICD-10-CM

## 2023-11-21 HISTORY — PX: BIOPSY: SHX5522

## 2023-11-21 HISTORY — PX: COLONOSCOPY WITH PROPOFOL: SHX5780

## 2023-11-21 HISTORY — PX: ESOPHAGOGASTRODUODENOSCOPY (EGD) WITH PROPOFOL: SHX5813

## 2023-11-21 SURGERY — COLONOSCOPY WITH PROPOFOL
Anesthesia: General

## 2023-11-21 MED ORDER — DEXMEDETOMIDINE HCL IN NACL 80 MCG/20ML IV SOLN
INTRAVENOUS | Status: DC | PRN
  Administered 2023-11-21: 12 ug via INTRAVENOUS
  Administered 2023-11-21: 8 ug via INTRAVENOUS
  Administered 2023-11-21: 12 ug via INTRAVENOUS
  Administered 2023-11-21: 8 ug via INTRAVENOUS

## 2023-11-21 MED ORDER — PROPOFOL 10 MG/ML IV BOLUS
INTRAVENOUS | Status: DC | PRN
  Administered 2023-11-21: 50 mg via INTRAVENOUS
  Administered 2023-11-21: 30 mg via INTRAVENOUS
  Administered 2023-11-21: 50 mg via INTRAVENOUS
  Administered 2023-11-21: 20 mg via INTRAVENOUS

## 2023-11-21 MED ORDER — LIDOCAINE HCL (PF) 2 % IJ SOLN
INTRAMUSCULAR | Status: AC
Start: 2023-11-21 — End: ?
  Filled 2023-11-21: qty 5

## 2023-11-21 MED ORDER — PHENYLEPHRINE 80 MCG/ML (10ML) SYRINGE FOR IV PUSH (FOR BLOOD PRESSURE SUPPORT)
PREFILLED_SYRINGE | INTRAVENOUS | Status: DC | PRN
  Administered 2023-11-21: 160 ug via INTRAVENOUS

## 2023-11-21 MED ORDER — PANTOPRAZOLE SODIUM 40 MG PO TBEC: 40.0000 mg | DELAYED_RELEASE_TABLET | Freq: Two times a day (BID) | ORAL | 1 refills | Status: DC

## 2023-11-21 MED ORDER — SODIUM CHLORIDE 0.9 % IV SOLN: INTRAVENOUS | Status: DC

## 2023-11-21 MED ORDER — PROPOFOL 500 MG/50ML IV EMUL
INTRAVENOUS | Status: DC | PRN
  Administered 2023-11-21: 75 ug/kg/min via INTRAVENOUS

## 2023-11-21 MED ORDER — GLYCOPYRROLATE 0.2 MG/ML IJ SOLN
INTRAMUSCULAR | Status: DC | PRN
  Administered 2023-11-21: .2 mg via INTRAVENOUS

## 2023-11-21 MED ORDER — GLYCOPYRROLATE 0.2 MG/ML IJ SOLN
INTRAMUSCULAR | Status: AC
  Filled 2023-11-21: qty 1

## 2023-11-21 MED ORDER — LIDOCAINE HCL (CARDIAC) PF 100 MG/5ML IV SOSY
PREFILLED_SYRINGE | INTRAVENOUS | Status: DC | PRN
  Administered 2023-11-21: 60 mg via INTRAVENOUS

## 2023-11-21 NOTE — Anesthesia Postprocedure Evaluation (Signed)
Anesthesia Post Note  Patient: Hazleigh Manwaring  Procedure(s) Performed: COLONOSCOPY WITH PROPOFOL ESOPHAGOGASTRODUODENOSCOPY (EGD) WITH PROPOFOL BIOPSY  Patient location during evaluation: PACU Anesthesia Type: General Level of consciousness: awake and alert Pain management: pain level controlled Vital Signs Assessment: post-procedure vital signs reviewed and stable Respiratory status: spontaneous breathing, nonlabored ventilation and respiratory function stable Cardiovascular status: blood pressure returned to baseline and stable Postop Assessment: no apparent nausea or vomiting Anesthetic complications: no   No notable events documented.   Last Vitals:  Vitals:   11/21/23 1411 11/21/23 1416  BP: 101/73 99/71  Pulse: 64 (!) 52  Resp:    Temp:    SpO2: 97% 100%    Last Pain:  Vitals:   11/21/23 1411  TempSrc:   PainSc: 0-No pain                 Foye Deer

## 2023-11-21 NOTE — H&P (Signed)
Arlyss Repress, MD [address]  [address]  [place], [place] [address]  Main: [phone]  Fax: [phone] Pager: [phone]  Primary Care Physician:  Jackolyn Confer, MD Primary Gastroenterologist:  Dr. Arlyss Repress  Pre-Procedure History & Physical: HPI:  Alyssa Cole is a 48 y.o. female is here for an endoscopy and colonoscopy.   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 Authorizing Provider  nicotine (NICODERM CQ - DOSED IN MG/24 HOURS) 14 mg/24hr patch Place 1 patch (14 mg total) onto the skin daily. 10/25/23   Jackolyn Confer, MD  pantoprazole (PROTONIX) 40 MG tablet Take 1 tablet (40 mg total) by mouth 2 (two) times daily before a meal. 10/18/23 10/17/24  Jackolyn Confer, MD  polycarbophil (FIBERCON) 625 MG tablet Take 625 mg by mouth daily.    [provider]  polyethylene glycol powder (GLYCOLAX/MIRALAX) 17 GM/SCOOP powder Take 255 g by mouth daily. 11/13/23   Toney Reil, MD  topiramate (TOPAMAX) 25 MG tablet Take 1 tablet (25 mg total) by mouth daily. 10/18/23   Jackolyn Confer, MD  traZODone (DESYREL) 50 MG tablet Take 0.5-1 tablets (25-50 mg total) by mouth at bedtime as needed for sleep. 09/20/23   Jackolyn Confer, MD  varenicline (CHANTIX) 0.5 MG tablet Take 1 tab (0.5 mg) orally once daily for 3 days, then 0.5 mg (1 tab) twice daily on days 4 through 7, and then 1 mg (2 tabs) twice daily thereafter for 12 weeks of treatment. 09/25/23   Jackolyn Confer, MD    Allergies as of 11/13/2023   (No Known Allergies)    Family  History  Problem Relation Age of Onset   Diabetes Mother    Hypertension Mother    Heart attack Mother    Hypertension Father    Dementia Father    Emphysema Maternal Grandmother    Bone cancer Maternal Grandfather    Tuberculosis Paternal Grandmother    Heart attack Paternal Grandfather     Social History   Socioeconomic History   Marital status: Married    Spouse name: Not on file   Number of children: Not on file   Years of education: Not on file   Highest education level: Not on file  Occupational History   Not on file  Tobacco Use   Smoking status: Former    Average packs/day: 1 pack/day for 15.0 years (15.0 ttl pk-yrs)    Types: Cigarettes    Start date: 10/07/2008   Smokeless tobacco: Never  Vaping Use   Vaping status: Never Used  Substance and Sexual Activity   Alcohol use: Yes    Comment: occ   Drug use: Never   Sexual activity: Yes  Other Topics Concern   Not on file  Social History Narrative   Not on file   Social Drivers of Health   Financial Resource Strain: Low Risk  (10/18/2023)   Overall Financial Resource Strain (CARDIA)    Difficulty of Paying Living Expenses: Not hard at all  Food Insecurity:  No Food Insecurity (10/18/2023)   Hunger Vital Sign    Worried About Running Out of Food in the Last Year: Never true    Ran Out of Food in the Last Year: Never true  Transportation Needs: No Transportation Needs (10/18/2023)   PRAPARE - Administrator, Civil Service (Medical): No    Lack of Transportation (Non-Medical): No  Physical Activity: Insufficiently Active (10/18/2023)   Exercise Vital Sign    Days of Exercise per Week: 4 days    Minutes of Exercise per Session: 10 min  Stress: No Stress Concern Present (10/18/2023)   [employer] of Occupational Health - Occupational Stress Questionnaire    Feeling of Stress : Only a little  Social Connections: Socially Integrated (10/18/2023)   Social Connection and Isolation Panel  [NHANES]    Frequency of Communication with Friends and Family: Never    Frequency of Social Gatherings with Friends and Family: More than three times a week    Attends Religious Services: More than 4 times per year    Active Member of [employer] or Organizations: Yes    Attends Engineer, structural: More than 4 times per year    Marital Status: Married  Catering manager Violence: Not At Risk (10/18/2023)   Humiliation, Afraid, Rape, and Kick questionnaire    Fear of Current or Ex-Partner: No    Emotionally Abused: No    Physically Abused: No    Sexually Abused: No    Review of Systems: See HPI, otherwise negative ROS  Physical Exam: BP (!) 136/95   Pulse 72   Temp (!) 97.1 F (36.2 C) (Temporal)   Resp 16   Ht 5\' 3"  (1.6 m)   Wt 77.8 kg   SpO2 100%   BMI 30.40 kg/m  General:   Alert,  pleasant and cooperative in NAD Head:  Normocephalic and atraumatic. Neck:  Supple; no masses or thyromegaly. Lungs:  Clear throughout to auscultation.    Heart:  Regular rate and rhythm. Abdomen:  Soft, nontender and nondistended. Normal bowel sounds, without guarding, and without rebound.   Neurologic:  Alert and  oriented x4;  grossly normal neurologically.  Impression/Plan: Alyssa Cole is here for an endoscopy and colonoscopy to be performed for dyspepsia, colon cancer screening  Risks, benefits, limitations, and alternatives regarding  endoscopy and colonoscopy have been reviewed with the patient.  Questions have been answered.  All parties agreeable.   Lannette Donath, MD  11/21/2023, 1:00 PM

## 2023-11-21 NOTE — Op Note (Signed)
[facility] Gastroenterology Patient Name: Alyssa Cole Procedure Date: 11/21/2023 11:56 AM MRN: [id number] Account #: [id number] Date of Birth: 1976/04/27 Admit Type: Outpatient Age: 48 Room: [facility] ENDO ROOM 3 Gender: Female Note Status: Finalized Instrument Name: Upper Endoscope [id number] Procedure:             Upper GI endoscopy Indications:           Epigastric abdominal pain Providers:             Toney Reil MD, MD Referring MD:          No Local Md, MD (Referring MD) Medicines:             General Anesthesia Complications:         No immediate complications. Estimated blood loss: None. Procedure:             Pre-Anesthesia Assessment:                        - Prior to the procedure, a History and Physical was                         performed, and patient medications and allergies were                         reviewed. The patient is competent. The risks and                         benefits of the procedure and the sedation options and                         risks were discussed with the patient. All questions                         were answered and informed consent was obtained.                         Patient identification and proposed procedure were                         verified by the physician, the nurse, the                         anesthesiologist, the anesthetist and the technician                         in the pre-procedure area in the procedure room in the                         endoscopy suite. Mental Status Examination: alert and                         oriented. Airway Examination: normal oropharyngeal                         airway and neck mobility. Respiratory Examination:                         clear to auscultation. CV Examination: normal.  Prophylactic Antibiotics: The patient does not require                         prophylactic antibiotics. Prior Anticoagulants: The                         patient  has taken no anticoagulant or antiplatelet                         agents. ASA Grade Assessment: II - A patient with mild                         systemic disease. After reviewing the risks and                         benefits, the patient was deemed in satisfactory                         condition to undergo the procedure. The anesthesia                         plan was to use general anesthesia. Immediately prior                         to administration of medications, the patient was                         re-assessed for adequacy to receive sedatives. The                         heart rate, respiratory rate, oxygen saturations,                         blood pressure, adequacy of pulmonary ventilation, and                         response to care were monitored throughout the                         procedure. The physical status of the patient was                         re-assessed after the procedure.                        After obtaining informed consent, the endoscope was                         passed under direct vision. Throughout the procedure,                         the patient's blood pressure, pulse, and oxygen                         saturations were monitored continuously. The Endoscope                         was introduced through the mouth, and advanced to the  second part of duodenum. The upper GI endoscopy was                         accomplished without difficulty. The patient tolerated                         the procedure well. Findings:      The duodenal bulb and second portion of the duodenum were normal.      The entire examined stomach was normal. Biopsies were taken with a cold       forceps for histology.      The cardia and gastric fundus were normal on retroflexion.      Esophagogastric landmarks were identified: the gastroesophageal junction       was found at 40 cm from the incisors.      The gastroesophageal junction and  examined esophagus were normal.       Biopsies were taken with a cold forceps for histology. Impression:            - Normal duodenal bulb and second portion of the                         duodenum.                        - Normal stomach. Biopsied.                        - Esophagogastric landmarks identified.                        - Normal gastroesophageal junction and esophagus.                         Biopsied. Recommendation:        - Await pathology results.                        - Continue present medications.                        - Proceed with colonoscopy as scheduled                        See colonoscopy report Procedure Code(s):     --- Professional ---                        [phone], Esophagogastroduodenoscopy, flexible,                         transoral; with biopsy, single or multiple Diagnosis Code(s):     --- Professional ---                        R10.13, Epigastric pain CPT copyright 2022 American Medical Association. All rights reserved. The codes documented in this report are preliminary and upon coder review may  be revised to meet current compliance requirements. Dr. Libby Maw Toney Reil MD, MD 11/21/2023 1:12:43 PM This report has been signed electronically. Number of Addenda: 0 Note Initiated On: 11/21/2023 11:56 AM Estimated Blood Loss:  Estimated blood loss: none.      [facility]

## 2023-11-21 NOTE — Op Note (Signed)
[facility] Gastroenterology Patient Name: Alyssa Cole Procedure Date: 11/21/2023 11:55 AM MRN: [id number] Account #: [id number] Date of Birth: [birth year] Admit Type: Outpatient Age: 48 Room: [facility] ENDO ROOM 3 Gender: Female Note Status: Finalized Instrument Name: Nelda Marseille [id number] Procedure:             Colonoscopy Indications:           Screening for colorectal malignant neoplasm, This is                         the patient's first colonoscopy Providers:             Toney Reil MD, MD Referring MD:          No Local Md, MD (Referring MD) Medicines:             General Anesthesia Complications:         No immediate complications. Estimated blood loss: None. Procedure:             Pre-Anesthesia Assessment:                        - Prior to the procedure, a History and Physical was                         performed, and patient medications and allergies were                         reviewed. The patient is competent. The risks and                         benefits of the procedure and the sedation options and                         risks were discussed with the patient. All questions                         were answered and informed consent was obtained.                         Patient identification and proposed procedure were                         verified by the physician, the nurse, the                         anesthesiologist, the anesthetist and the technician                         in the pre-procedure area in the procedure room in the                         endoscopy suite. Mental Status Examination: alert and                         oriented. Airway Examination: normal oropharyngeal                         airway and neck mobility. Respiratory Examination:  clear to auscultation. CV Examination: normal.                         Prophylactic Antibiotics: The patient does not require                         prophylactic  antibiotics. Prior Anticoagulants: The                         patient has taken no anticoagulant or antiplatelet                         agents. ASA Grade Assessment: II - A patient with mild                         systemic disease. After reviewing the risks and                         benefits, the patient was deemed in satisfactory                         condition to undergo the procedure. The anesthesia                         plan was to use general anesthesia. Immediately prior                         to administration of medications, the patient was                         re-assessed for adequacy to receive sedatives. The                         heart rate, respiratory rate, oxygen saturations,                         blood pressure, adequacy of pulmonary ventilation, and                         response to care were monitored throughout the                         procedure. The physical status of the patient was                         re-assessed after the procedure.                        After obtaining informed consent, the colonoscope was                         passed under direct vision. Throughout the procedure,                         the patient's blood pressure, pulse, and oxygen                         saturations were monitored continuously. The  Colonoscope was introduced through the anus and                         advanced to the the cecum, identified by appendiceal                         orifice and ileocecal valve. The colonoscopy was                         performed with moderate difficulty due to significant                         looping. Successful completion of the procedure was                         aided by applying abdominal pressure. The patient                         tolerated the procedure fairly well. The quality of                         the bowel preparation was evaluated using the BBPS                         [facility]  Bowel Preparation Scale) with scores of: Right                         Colon = 3, Transverse Colon = 3 and Left Colon = 3                         (entire mucosa seen well with no residual staining,                         small fragments of stool or opaque liquid). The total                         BBPS score equals 9. The ileocecal valve, appendiceal                         orifice, and rectum were photographed. Findings:      The perianal and digital rectal examinations were normal. Pertinent       negatives include normal sphincter tone and no palpable rectal lesions.      A 5 mm polyp was found in the descending colon. The polyp was sessile.       The polyp was removed with a cold snare. Resection and retrieval were       complete. Estimated blood loss: none.      The retroflexed view of the distal rectum and anal verge was normal and       showed no anal or rectal abnormalities. Impression:            - One 5 mm polyp in the descending colon, removed with                         a cold snare. Resected and retrieved.                        -  The distal rectum and anal verge are normal on                         retroflexion view. Recommendation:        - Discharge patient to home (with escort).                        - Resume previous diet today.                        - Continue present medications.                        - Await pathology results.                        - Repeat colonoscopy in 7-10 years for surveillance                         based on pathology results. Procedure Code(s):     --- Professional ---                        [phone], Colonoscopy, flexible; with removal of                         tumor(s), polyp(s), or other lesion(s) by snare                         technique Diagnosis Code(s):     --- Professional ---                        Z12.11, Encounter for screening for malignant neoplasm                         of colon                        D12.4, Benign  neoplasm of descending colon CPT copyright 2022 American Medical Association. All rights reserved. The codes documented in this report are preliminary and upon coder review may  be revised to meet current compliance requirements. Dr. Libby Maw Toney Reil MD, MD 11/21/2023 1:32:29 PM This report has been signed electronically. Number of Addenda: 0 Note Initiated On: 11/21/2023 11:55 AM Scope Withdrawal Time: 0 hours 9 minutes 8 seconds  Total Procedure Duration: 0 hours 16 minutes 3 seconds  Estimated Blood Loss:  Estimated blood loss: none.      [facility]

## 2023-11-21 NOTE — Anesthesia Preprocedure Evaluation (Addendum)
Anesthesia Evaluation  Patient identified by MRN, date of birth, ID band Patient awake    Reviewed: Allergy & Precautions, H&P , NPO status , Patient's Chart, lab work & pertinent test results  Airway Mallampati: II  TM Distance: >3 FB Neck ROM: full    Dental no notable dental hx.    Pulmonary former smoker   Pulmonary exam normal        Cardiovascular negative cardio ROS Normal cardiovascular exam     Neuro/Psych  PSYCHIATRIC DISORDERS  Depression    negative neurological ROS     GI/Hepatic Neg liver ROS,GERD  ,,  Endo/Other  negative endocrine ROS    Renal/GU negative Renal ROS  negative genitourinary   Musculoskeletal   Abdominal  (+) + obese  Peds  Hematology negative hematology ROS (+)   Anesthesia Other Findings Past Medical History: 07/20/2019: Alcohol abuse 02/25/2013: AMA (advanced maternal age) multigravida 35+ No date: Depression 07/19/2019: Major depressive disorder, recurrent severe without  psychotic features (HCC) 02/25/2013: Placenta previa 02/25/2013: Tobacco abuse  Past Surgical History: No date: CESAREAN SECTION No date: NASAL SINUS SURGERY     Reproductive/Obstetrics negative OB ROS                             Anesthesia Physical Anesthesia Plan  ASA: 2  Anesthesia Plan: General   Post-op Pain Management:    Induction: Intravenous  PONV Risk Score and Plan: Propofol infusion and TIVA  Airway Management Planned: Natural Airway  Additional Equipment:   Intra-op Plan:   Post-operative Plan:   Informed Consent: I have reviewed the patients History and Physical, chart, labs and discussed the procedure including the risks, benefits and alternatives for the proposed anesthesia with the patient or authorized representative who has indicated his/her understanding and acceptance.     Dental Advisory Given  Plan Discussed with: CRNA and  Surgeon  Anesthesia Plan Comments:         Anesthesia Quick Evaluation

## 2023-11-21 NOTE — Transfer of Care (Signed)
Immediate Anesthesia Transfer of Care Note  Patient: Alyssa Cole  Procedure(s) Performed: COLONOSCOPY WITH PROPOFOL ESOPHAGOGASTRODUODENOSCOPY (EGD) WITH PROPOFOL BIOPSY  Patient Location: PACU  Anesthesia Type:General  Level of Consciousness: sedated  Airway & Oxygen Therapy: Patient Spontanous Breathing and Patient connected to nasal cannula oxygen  Post-op Assessment: Report given to RN and Post -op Vital signs reviewed and stable  Post vital signs: Reviewed and stable  Last Vitals:  Vitals Value Taken Time  BP 96/69 11/21/23 1336  Temp 35.7 C 11/21/23 1334  Pulse 51 11/21/23 1336  Resp    SpO2 98 % 11/21/23 1336  Vitals shown include unfiled device data.  Last Pain:  Vitals:   11/21/23 1334  TempSrc: Temporal  PainSc: Asleep         Complications: No notable events documented.

## 2023-11-22 ENCOUNTER — Other Ambulatory Visit: Payer: Self-pay | Admitting: Pediatrics

## 2023-11-22 ENCOUNTER — Encounter: Payer: Self-pay | Admitting: Gastroenterology

## 2023-11-22 DIAGNOSIS — F17210 Nicotine dependence, cigarettes, uncomplicated: Secondary | ICD-10-CM

## 2023-11-22 LAB — SURGICAL PATHOLOGY

## 2023-11-22 MED ORDER — VARENICLINE TARTRATE 0.5 MG PO TABS: 1.0000 mg | ORAL_TABLET | Freq: Two times a day (BID) | ORAL | 1 refills | Status: DC

## 2023-11-22 NOTE — Telephone Encounter (Signed)
Requested medication (s) are due for refill today: Yes  Requested medication (s) are on the active medication list: Yes  Last refill:  09/25/23, #100  Future visit scheduled: Yes  Notes to clinic:  Manual review required, Note from pharmacy (please do not refill starter pack.)     Requested Prescriptions  Pending Prescriptions Disp Refills   varenicline (CHANTIX) 0.5 MG tablet 100 tablet 1    Sig: Take 1 tab (0.5 mg) orally once daily for 3 days, then 0.5 mg (1 tab) twice daily on days 4 through 7, and then 1 mg (2 tabs) twice daily thereafter for 12 weeks of treatment.     Psychiatry:  Drug Dependence Therapy - varenicline Failed - 11/22/2023  3:09 PM      Failed - Manual Review: Do not refill starter pack. 1 mg tabs may be extended up to one year if the patient has quit smoking but still feels at risk for relapse.      Passed - Cr in normal range and within 180 days    Creatinine  Date Value Ref Range Status  02/25/2013 0.28 (L) 0.60 - 1.30 mg/dL Final   Creatinine, Ser  Date Value Ref Range Status  10/14/2023 0.62 0.44 - 1.00 mg/dL Final         Passed - Completed PHQ-2 or PHQ-9 in the last 360 days      Passed - Valid encounter within last 6 months    Recent Outpatient Visits           4 weeks ago Epigastric pain   [facility] Jackolyn Confer, MD   1 month ago Annual physical exam   [facility] Jack[facility]yn Confer, MD   2 months ago Encounter to establish care   [facility] Jackolyn Confer, MD       Future Appointments             In 2 months Evelene Croon, Atilano Median, MD [facility]

## 2023-11-22 NOTE — Telephone Encounter (Signed)
Medication Refill -  Most Recent Primary Care Visit:  Provider: Jackolyn Confer  Department: [facility]  Visit Type: OFFICE VISIT  Date: 10/25/2023  Medication: varenicline (CHANTIX) 0.5 MG tablet [[id number]]   Has the patient contacted their pharmacy? No (Agent: If no, request that the patient contact the pharmacy for the refill. If patient does not wish to contact the pharmacy document the reason why and proceed with request.) (Agent: If yes, when and what did the pharmacy advise?)  Is this the correct pharmacy for this prescription? Yes If no, delete pharmacy and type the correct one.  This is the patient's preferred pharmacy:  [facility] [address] [place] - [address]) [place] [address] Phone: [phone] Fax: [phone]    Is the patient out of the medication? No  1 day left   Has the patient been seen for an appointment in the last year OR does the patient have an upcoming appointment? Yes  Can we respond through MyChart? Yes  Agent: Please be advised that Rx refills may take up to 3 business days. We ask that you follow-up with your pharmacy.

## 2023-11-25 ENCOUNTER — Encounter: Payer: Self-pay | Admitting: Gastroenterology

## 2023-11-25 ENCOUNTER — Other Ambulatory Visit: Payer: Self-pay

## 2023-11-25 DIAGNOSIS — F17210 Nicotine dependence, cigarettes, uncomplicated: Secondary | ICD-10-CM

## 2023-11-26 ENCOUNTER — Encounter: Payer: Self-pay | Admitting: Student in an Organized Health Care Education/Training Program

## 2023-11-26 ENCOUNTER — Ambulatory Visit: Payer: BC Managed Care – PPO | Admitting: Student in an Organized Health Care Education/Training Program

## 2023-11-26 VITALS — BP 106/82 | HR 74 | Temp 98.5°F | Ht 63.0 in | Wt 172.2 lb

## 2023-11-26 DIAGNOSIS — R911 Solitary pulmonary nodule: Secondary | ICD-10-CM | POA: Diagnosis not present

## 2023-11-26 DIAGNOSIS — R0602 Shortness of breath: Secondary | ICD-10-CM

## 2023-11-26 MED ORDER — NICOTINE 14 MG/24HR TD PT24: 14.0000 mg | MEDICATED_PATCH | Freq: Every day | TRANSDERMAL | 0 refills | Status: DC

## 2023-11-26 NOTE — Progress Notes (Signed)
Synopsis: Referred in for shortness of breath by Jackolyn Confer, MD  Assessment & Plan:   1. Shortness of breath (Primary)  Presenting for shortness of breath of 1 year in duration in the setting of significant smoking history as well as family history of asthma.  I have personally reviewed her in-office spirometry and both FEV1 and FVC appear to be decreased without a bronchodilator response.  Spirometry effort seems to be suboptimal based on FVC effort and I will reevaluate this with full pulmonary function testing to include spirometry, lung volumes, and DLCO.  Given family history of asthma, this certainly is on the differential as well as COPD or PRISM (preserved ratio, impaired spirometry).  - Pulmonary Function Test [facility] Only; Future - CT CHEST WO CONTRAST; Future  2. Pulmonary nodule  I have personally reviewed her abdominal CT scan looking of the pulmonary windows and note subcentimeter pulmonary nodule in the left lower lobe.  The most likely explanation behind this nodule is that of a previously healed infection but I did discuss with the patient that given her smoking history it would be wise to obtain a dedicated CT scan of the chest to further evaluate this.  Did explain risks and benefits of getting repeat CT scan including risk of radiation to the breast.  - CT CHEST WO CONTRAST; Future   Return in about 3 months (around 02/24/2024).  I spent 60 minutes caring for this patient today, including preparing to see the patient, obtaining a medical history , reviewing a separately obtained history, performing a medically appropriate examination and/or evaluation, counseling and educating the patient/family/caregiver, ordering medications, tests, or procedures, and documenting clinical information in the electronic health record  Raechel Chute, MD [facility] 11/26/2023 12:50 PM    End of visit medications:  No orders of the defined types were placed in  this encounter.    Current Outpatient Medications:    pantoprazole (PROTONIX) 40 MG tablet, Take 1 tablet (40 mg total) by mouth 2 (two) times daily before a meal., Disp: 180 tablet, Rfl: 1   polycarbophil (FIBERCON) 625 MG tablet, Take 625 mg by mouth daily., Disp: , Rfl:    polyethylene glycol powder (GLYCOLAX/MIRALAX) 17 GM/SCOOP powder, Take 255 g by mouth daily., Disp: 238 g, Rfl: 0   topiramate (TOPAMAX) 25 MG tablet, Take 1 tablet (25 mg total) by mouth daily., Disp: 90 tablet, Rfl: 3   traZODone (DESYREL) 50 MG tablet, Take 0.5-1 tablets (25-50 mg total) by mouth at bedtime as needed for sleep., Disp: 30 tablet, Rfl: 3   varenicline (CHANTIX) 0.5 MG tablet, Take 2 tablets (1 mg total) by mouth 2 (two) times daily. Take 1 tab (0.5 mg) orally once daily for 3 days, then 0.5 mg (1 tab) twice daily on days 4 through 7, and then 1 mg (2 tabs) twice daily thereafter for 12 weeks of treatment., Disp: 60 tablet, Rfl: 1   nicotine (NICODERM CQ - DOSED IN MG/24 HOURS) 14 mg/24hr patch, Place 1 patch (14 mg total) onto the skin daily., Disp: 28 patch, Rfl: 0   Subjective:   PATIENT ID: Alyssa Cole GENDER: female DOB: [birth year], MRN: [id number]  Chief Complaint  Patient presents with   Consult    Chronic cough x 1 year. Shortness of breath on exertion. occasional wheezing.     HPI  Patient is a pleasant 48 year old female presenting to clinic for the evaluation of shortness of breath.  Patient reports symptom onset of around 1  year ago.  She experienced shortness of breath with minimal exertion (going up a flight of stairs, walking short distances) in addition to a cough that is at times productive of yellow/white sputum.  She also endorses occasional wheezing that she describes as a whistling sound in her chest.  Patient denies any fevers, chills, night sweats, or weight loss.  She has gained some weight and has been trying to lose it.  She does not have any chest pain or chest tightness  at the moment, but does describe some chest tightness in the past that she had discussed with her primary care physician and which has spontaneously resolved.  She was seen by her primary care physician where smoking cessation was discussed (patient successfully quit 10/2023 with Chantix and nicotine patches).  She had also endorsed shortness of breath to PCP and an office spirometry was performed showing decreased FEV1 and FVC.  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  She has a notable smoking history, having started at the age of 21, and quit in December 2024.  She has around 30 pack years of smoking history.  She has no other personal past medical history.  She does have family history notable for asthma in both her children.  She has 3 dogs.  Ancillary information including prior medications, full medical/surgical/family/social histories, and PFTs (when available) are listed below and have been reviewed.   Review of Systems  Constitutional:  Negative for chills, fever and weight loss.  Respiratory:  Positive for shortness of breath. Negative for cough, hemoptysis, sputum production and wheezing.   Cardiovascular:  Negative for chest pain and palpitations.     Objective:   Vitals:   11/26/23 0837  BP: 106/82  Pulse: 74  Temp: 98.5 F (36.9 C)  TempSrc: Temporal  SpO2: 100%  Weight: 172 lb 3.2 oz (78.1 kg)  Height: 5\' 3"  (1.6 m)   100% on RA BMI Readings from Last 3 Encounters:  11/26/23 30.50 kg/m  11/21/23 30.40 kg/m  11/13/23 29.66 kg/m   Wt Readings from Last 3 Encounters:  11/26/23 172 lb 3.2 oz (78.1 kg)  11/21/23 171 lb 9.6 oz (77.8 kg)  11/13/23 177 lb 2 oz (80.3 kg)    Physical Exam Vitals reviewed.  Constitutional:      Appearance: Normal appearance.  Cardiovascular:     Rate and Rhythm: Normal rate and regular rhythm.     Pulses: Normal pulses.     Heart sounds: Normal heart sounds.  Pulmonary:     Effort: Pulmonary effort  is normal.     Breath sounds: Normal breath sounds. No wheezing or rales.  Abdominal:     Palpations: Abdomen is soft.  Neurological:     General: No focal deficit present.     Mental Status: She is alert and oriented to person, place, and time. Mental status is at baseline.       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   Placenta previa 02/25/2013   Tobacco abuse 02/25/2013     Family History  Problem Relation Age of Onset   Diabetes Mother    Hypertension Mother    Heart attack Mother    Hypertension Father    Dementia Father    Emphysema Maternal Grandmother    Bone  cancer Maternal Grandfather    Tuberculosis Paternal Grandmother    Heart attack Paternal Grandfather      Past Surgical History:  Procedure Laterality Date   BIOPSY  11/21/2023   Procedure: BIOPSY;  Surgeon: Toney Reil, MD;  Location: [facility] ENDOSCOPY;  Service: Gastroenterology;;   CESAREAN SECTION     COLONOSCOPY WITH PROPOFOL N/A 11/21/2023   Procedure: COLONOSCOPY WITH PROPOFOL;  Surgeon: Toney Reil, MD;  Location: [facility] ENDOSCOPY;  Service: Gastroenterology;  Laterality: N/A;   ESOPHAGOGASTRODUODENOSCOPY (EGD) WITH PROPOFOL N/A 11/21/2023   Procedure: ESOPHAGOGASTRODUODENOSCOPY (EGD) WITH PROPOFOL;  Surgeon: Toney Reil, MD;  Location: [facility] ENDOSCOPY;  Service: Gastroenterology;  Laterality: N/A;   NASAL SINUS SURGERY      Social History   Socioeconomic History   Marital status: Married    Spouse name: Not on file   Number of children: Not on file   Years of education: Not on file   Highest education level: Not on file  Occupational History   Not on file  Tobacco Use   Smoking status: Former    Average packs/day: 1 pack/day for 15.0 years (15.0 ttl pk-yrs)     Types: Cigarettes    Start date: 10/07/2008   Smokeless tobacco: Never  Vaping Use   Vaping status: Never Used  Substance and Sexual Activity   Alcohol use: Yes    Comment: occ   Drug use: Never   Sexual activity: Yes  Other Topics Concern   Not on file  Social History Narrative   Not on file   Social Drivers of Health   Financial Resource Strain: Low Risk  (10/18/2023)   Overall Financial Resource Strain (CARDIA)    Difficulty of Paying Living Expenses: Not hard at all  Food Insecurity: No Food Insecurity (10/18/2023)   Hunger Vital Sign    Worried About Running Out of Food in the Last Year: Never true    Ran Out of Food in the Last Year: Never true  Transportation Needs: No Transportation Needs (10/18/2023)   PRAPARE - Administrator, Civil Service (Medical): No    Lack of Transportation (Non-Medical): No  Physical Activity: Insufficiently Active (10/18/2023)   Exercise Vital Sign    Days of Exercise per Week: 4 days    Minutes of Exercise per Session: 10 min  Stress: No Stress Concern Present (10/18/2023)   [employer] of Occupational Health - Occupational Stress Questionnaire    Feeling of Stress : Only a little  Social Connections: Socially Integrated (10/18/2023)   Social Connection and Isolation Panel [NHANES]    Frequency of Communication with Friends and Family: Never    Frequency of Social Gatherings with Friends and Family: More than three times a week    Attends Religious Services: More than 4 times per year    Active Member of Clubs or Organizations: Yes    Attends Banker Meetings: More than 4 times per year    Marital Status: Married  Catering manager Violence: Not At Risk (10/18/2023)   Humiliation, Afraid, Rape, and Kick questionnaire    Fear of Current or Ex-Partner: No    Emotionally Abused: No    Physically Abused: No    Sexually Abused: No     No Known Allergies   CBC    Component Value Date/Time   WBC 7.2  10/14/2023 0729   RBC 4.16 10/14/2023 0729   HGB 13.3 10/14/2023 0729   HGB 14.0 09/20/2023 1050   HCT 39.0  10/14/2023 0729   HCT 41.7 09/20/2023 1050   PLT 262 10/14/2023 0729   PLT 309 09/20/2023 1050   MCV 93.8 10/14/2023 0729   MCV 96 09/20/2023 1050   MCV 95 02/25/2013 0606   MCH 32.0 10/14/2023 0729   MCHC 34.1 10/14/2023 0729   RDW 12.4 10/14/2023 0729   RDW 11.9 09/20/2023 1050   RDW 13.0 02/25/2013 0606   LYMPHSABS 3.1 09/20/2023 1050   LYMPHSABS 1.4 02/25/2013 0606   MONOABS 0.3 02/25/2013 0606   EOSABS 0.5 (H) 09/20/2023 1050   EOSABS 0.0 02/25/2013 0606   BASOSABS 0.0 09/20/2023 1050   BASOSABS 0.0 02/25/2013 0606    Pulmonary Functions Testing Results:     No data to display          Outpatient Medications Prior to Visit  Medication Sig Dispense Refill   pantoprazole (PROTONIX) 40 MG tablet Take 1 tablet (40 mg total) by mouth 2 (two) times daily before a meal. 180 tablet 1   polycarbophil (FIBERCON) 625 MG tablet Take 625 mg by mouth daily.     polyethylene glycol powder (GLYCOLAX/MIRALAX) 17 GM/SCOOP powder Take 255 g by mouth daily. 238 g 0   topiramate (TOPAMAX) 25 MG tablet Take 1 tablet (25 mg total) by mouth daily. 90 tablet 3   traZODone (DESYREL) 50 MG tablet Take 0.5-1 tablets (25-50 mg total) by mouth at bedtime as needed for sleep. 30 tablet 3   varenicline (CHANTIX) 0.5 MG tablet Take 2 tablets (1 mg total) by mouth 2 (two) times daily. Take 1 tab (0.5 mg) orally once daily for 3 days, then 0.5 mg (1 tab) twice daily on days 4 through 7, and then 1 mg (2 tabs) twice daily thereafter for 12 weeks of treatment. 60 tablet 1   nicotine (NICODERM CQ - DOSED IN MG/24 HOURS) 14 mg/24hr patch Place 1 patch (14 mg total) onto the skin daily. 28 patch 0   No facility-administered medications prior to visit.

## 2023-11-27 MED ORDER — VARENICLINE TARTRATE 1 MG PO TABS: ORAL_TABLET | ORAL | 0 refills | Status: DC

## 2023-11-28 ENCOUNTER — Ambulatory Visit: Payer: BC Managed Care – PPO | Admitting: Gastroenterology

## 2023-12-03 ENCOUNTER — Other Ambulatory Visit: Payer: Self-pay | Admitting: Pediatrics

## 2023-12-03 DIAGNOSIS — Z1231 Encounter for screening mammogram for malignant neoplasm of breast: Secondary | ICD-10-CM

## 2023-12-27 ENCOUNTER — Ambulatory Visit
Admission: RE | Admit: 2023-12-27 | Discharge: 2023-12-27 | Disposition: A | Discharge status: Home / Self Care | Payer: BC Managed Care – PPO | Source: Ambulatory Visit

## 2023-12-27 DIAGNOSIS — Z1231 Encounter for screening mammogram for malignant neoplasm of breast: Secondary | ICD-10-CM | POA: Diagnosis not present

## 2024-01-04 ENCOUNTER — Other Ambulatory Visit: Payer: Self-pay | Admitting: Pediatrics

## 2024-01-04 DIAGNOSIS — G4700 Insomnia, unspecified: Secondary | ICD-10-CM

## 2024-01-06 NOTE — Telephone Encounter (Signed)
 Requested Prescriptions  Pending Prescriptions Disp Refills   traZODone (DESYREL) 50 MG tablet [Pharmacy Med Name: traZODone HCl 50 MG Oral Tablet] 30 tablet 2    Sig: TAKE 1/2 TO 1 (ONE-HALF TO ONE) TABLET BY MOUTH AT BEDTIME AS NEEDED FOR SLEEP     Psychiatry: Antidepressants - Serotonin Modulator Passed - 01/06/2024  2:13 PM      Passed - Valid encounter within last 6 months    Recent Outpatient Visits           2 months ago Epigastric pain   [facility] Jackolyn Confer, MD   2 months ago Annual physical exam   [place] [facility] Jackolyn Confer, MD   3 months ago Encounter to establish care   [facility] Jackolyn Confer, MD       Future Appointments             In 4 weeks Evelene Croon, Atilano Median, MD  [facility]   In 2 months Raechel Chute, MD [facility]

## 2024-02-04 ENCOUNTER — Ambulatory Visit (INDEPENDENT_AMBULATORY_CARE_PROVIDER_SITE_OTHER): Payer: Self-pay | Admitting: Pediatrics

## 2024-02-04 ENCOUNTER — Encounter: Payer: Self-pay | Admitting: Pediatrics

## 2024-02-04 VITALS — BP 136/80 | HR 79 | Temp 98.2°F | Wt 174.0 lb

## 2024-02-04 DIAGNOSIS — F4325 Adjustment disorder with mixed disturbance of emotions and conduct: Secondary | ICD-10-CM

## 2024-02-04 DIAGNOSIS — Z6830 Body mass index (BMI) 30.0-30.9, adult: Secondary | ICD-10-CM | POA: Diagnosis not present

## 2024-02-04 DIAGNOSIS — R232 Flushing: Secondary | ICD-10-CM | POA: Diagnosis not present

## 2024-02-04 DIAGNOSIS — F17210 Nicotine dependence, cigarettes, uncomplicated: Secondary | ICD-10-CM

## 2024-02-04 DIAGNOSIS — N951 Menopausal and female climacteric states: Secondary | ICD-10-CM | POA: Diagnosis not present

## 2024-02-04 DIAGNOSIS — G4700 Insomnia, unspecified: Secondary | ICD-10-CM

## 2024-02-04 DIAGNOSIS — Z1330 Encounter for screening examination for mental health and behavioral disorders, unspecified: Secondary | ICD-10-CM

## 2024-02-04 MED ORDER — VARENICLINE TARTRATE 1 MG PO TABS: 1.0000 mg | ORAL_TABLET | Freq: Two times a day (BID) | ORAL | 0 refills | Status: DC

## 2024-02-04 MED ORDER — PAROXETINE HCL 10 MG PO TABS: 10.0000 mg | ORAL_TABLET | Freq: Every day | ORAL | 2 refills | Status: DC

## 2024-02-04 MED ORDER — NICOTINE POLACRILEX 2 MG MT LOZG: 2.0000 mg | LOZENGE | OROMUCOSAL | 1 refills | Status: AC | PRN

## 2024-02-04 MED ORDER — SEMAGLUTIDE-WEIGHT MANAGEMENT 0.25 MG/0.5ML ~~LOC~~ SOAJ: 0.2500 mg | SUBCUTANEOUS | 0 refills | Status: DC

## 2024-02-04 NOTE — Assessment & Plan Note (Addendum)
 She reports that trazodone helps her fall asleep but she wakes up early in the morning. She is still getting a sufficient amount of sleep overall. Paxil might also help with sleep disturbances. - Continue trazodone for sleep - Monitor sleep patterns after starting Paxil

## 2024-02-04 NOTE — Patient Instructions (Addendum)
 Stop topamax Start paxil 10mg , you can take half the tab the first 3-4 days then take the full tab  I will send more chantinx as well  The schedule for wegovy is: 0.25mg  for 4 weeks RETURN VISIT IN CLINIC TO CHECK IN 0.5mg  for 4 weeks 1.7mg  for 4 weeks 2.4mg  goal dose  The most common adverse reactions, reported in >=5% of patients treated with Ozempic are nausea, vomiting, diarrhea, abdominal pain, and constipation. These typically resolve over time but please let me know if they do not resolve.

## 2024-02-04 NOTE — Assessment & Plan Note (Signed)
 She is actively working on smoking cessation and is currently using Chantix, which helps with cravings. She has significantly reduced her smoking. Nicotine lozenges or gum were discussed as options to manage cravings throughout the day. She is also using mints and gum to help with cravings. Continuation of Chantix is advised, with a potential reduction to 0.5 mg twice daily as maintenance after the next month. Time spent: 12 min. - Continue Chantix 1 mg twice daily - Consider nicotine lozenges or gum for cravings - Provide refill for Chantix as needed - Consider reducing Chantix to 0.5 mg twice daily as maintenance after one month

## 2024-02-04 NOTE — Assessment & Plan Note (Signed)
 She is interested in weight management, particularly after quitting smoking. Wegovy, a GLP-1 agonist injection, is discussed and is covered by her insurance. She has researched this medication and is open to trying it. The administration process and potential side effects, including gastrointestinal issues such as nausea, constipation, acid reflux, and diarrhea, were explained. The decision to use Reginal Lutes is based on insurance coverage, as both [place] and Zeproam are effective. - Initiate [phone] for weight management - Monitor for gastrointestinal side effects and manage as needed - Check insurance coverage and obtain prior authorization if necessary

## 2024-02-04 NOTE — Progress Notes (Signed)
 Office Visit  BP 136/80   Pulse 79   Temp 98.2 F (36.8 C) (Oral)   Wt 174 lb (78.9 kg)   LMP 11/28/2023 (Exact Date)   SpO2 98%   BMI 30.82 kg/m    Subjective:    Patient ID: Alyssa Cole, female    DOB: 09-09-1976, 48 y.o.   MRN: [id number]  HPI: Alyssa Cole is a 48 y.o. female  Chief Complaint  Patient presents with   Weight Loss    Pt wants to discuss weight loss medication    Discussed the use of AI scribe software for clinical note transcription with the patient, who gave verbal consent to proceed.  History of Present Illness   Alyssa Cole is a 48 year old female who presents with menopausal symptoms and smoking cessation support.  She is experiencing severe hot flashes and mood swings, including irritability, which she describes as feeling like she is in 'full on menopause.' She has been trying to manage these symptoms without medication but continues to struggle with them.  She has recently quit smoking and is experiencing cravings, particularly for sweets. She has stopped using nicotine patches and is currently using Chantix 1 mg twice daily, which she feels helps with the cravings. She is also using mints and gum to manage cravings throughout the day. Her husband is also using Chantix, and she both find that cigarettes taste unpleasant now.  She has been exercising regularly, walking for 20 minutes three to four times a week. She is mindful of her diet, trying to keep sweet intake to a minimum despite cravings.  She has previously been on Paxil at age 63 for depression and tolerated it well. She is also taking trazodone, which helps her fall asleep, but she wakes up around 4 AM. She goes to bed early, around 9 PM, and feels she gets a sufficient amount of sleep.      Wt Readings from Last 3 Encounters:  02/04/24 174 lb (78.9 kg)  11/26/23 172 lb 3.2 oz (78.1 kg)  11/21/23 171 lb 9.6 oz (77.8 kg)     Relevant past medical, surgical, family and social  history reviewed and updated as indicated. Interim medical history since our last visit reviewed. Allergies and medications reviewed and updated.  ROS per HPI unless specifically indicated above     Objective:    BP 136/80   Pulse 79   Temp 98.2 F (36.8 C) (Oral)   Wt 174 lb (78.9 kg)   LMP 11/28/2023 (Exact Date)   SpO2 98%   BMI 30.82 kg/m   Wt Readings from Last 3 Encounters:  02/04/24 174 lb (78.9 kg)  11/26/23 172 lb 3.2 oz (78.1 kg)  11/21/23 171 lb 9.6 oz (77.8 kg)    Physical Exam Constitutional:      Appearance: Normal appearance.  Pulmonary:     Effort: Pulmonary effort is normal.  Musculoskeletal:        General: Normal range of motion.  Skin:    Comments: Normal skin color  Neurological:     General: No focal deficit present.     Mental Status: She is alert. Mental status is at baseline.  Psychiatric:        Mood and Affect: Mood normal.        Behavior: Behavior normal.        Thought Content: Thought content normal.         02/04/2024    8:12 AM 10/25/2023   11:03 AM  10/18/2023   12:04 PM 09/20/2023   10:07 AM  Depression screen PHQ 2/9  Decreased Interest 1 1 0 1  Down, Depressed, Hopeless 2 0 1 1  PHQ - 2 Score 3 1 1 2   Altered sleeping 2 0 0 2  Tired, decreased energy 2 1 1 2   Change in appetite 1 1 2 1   Feeling bad or failure about yourself  1 0 0 1  Trouble concentrating 2 1 1 1   Moving slowly or fidgety/restless 1 0 0 0  Suicidal thoughts 0 0 0 0  PHQ-9 Score 12 4 5 9   Difficult doing work/chores Somewhat difficult Not difficult at all Not difficult at all Somewhat difficult       02/04/2024    8:13 AM 10/25/2023   11:03 AM 10/18/2023   12:04 PM 09/20/2023   10:08 AM  GAD 7 : Generalized Anxiety Score  Nervous, Anxious, on Edge 1 1 1 1   Control/stop worrying 1 0 1 1  Worry too much - different things 1 0 1 1  Trouble relaxing 1 1 1 1   Restless 1 0 1 0  Easily annoyed or irritable 2 1 1 1   Afraid - awful might happen 1 0 0 0   Total GAD 7 Score 8 3 6 5   Anxiety Difficulty Somewhat difficult Not difficult at all Not difficult at all Somewhat difficult       Assessment & Plan:  Assessment & Plan   BMI 30.0-30.9,adult Assessment & Plan: She is interested in weight management, particularly after quitting smoking. Wegovy, a GLP-1 agonist injection, is discussed and is covered by her insurance. She has researched this medication and is open to trying it. The administration process and potential side effects, including gastrointestinal issues such as nausea, constipation, acid reflux, and diarrhea, were explained. The decision to use Reginal Lutes is based on insurance coverage, as both [place] and Zeproam are effective. - Initiate [phone] for weight management - Monitor for gastrointestinal side effects and manage as needed - Check insurance coverage and obtain prior authorization if necessary  Orders: -     Semaglutide-Weight Management; Inject 0.25 mg into the skin once a week.  Dispense: 2 mL; Refill: 0  Cigarette nicotine dependence without complication Assessment & Plan: She is actively working on smoking cessation and is currently using Chantix, which helps with cravings. She has significantly reduced her smoking. Nicotine lozenges or gum were discussed as options to manage cravings throughout the day. She is also using mints and gum to help with cravings. Continuation of Chantix is advised, with a potential reduction to 0.5 mg twice daily as maintenance after the next month. Time spent: 12 min. - Continue Chantix 1 mg twice daily - Consider nicotine lozenges or gum for cravings - Provide refill for Chantix as needed - Consider reducing Chantix to 0.5 mg twice daily as maintenance after one month  Orders: -     Varenicline Tartrate; Take 1 tablet (1 mg total) by mouth 2 (two) times daily. Take 1 mg (2 tabs) twice daily  Dispense: 120 tablet; Refill: 0 -     Nicotine Polacrilex; Take 1 lozenge (2 mg total) by mouth as  needed for smoking cessation.  Dispense: 100 tablet; Refill: 1  Hot flashes Menopausal symptoms Assessment & Plan: She experiences significant menopausal symptoms, including hot flashes, mood swings, and irritability, impacting her quality of life. Paxil, an antidepressant, is proposed at low doses to manage these symptoms, as it is effective for hot flashes.  She has previously tolerated Paxil well. Discontinuation of Topamax is advised as it has not been effective for her symptoms. - Prescribe Paxil at a low dose for menopausal symptoms - Discontinue Topamax - Schedule follow-up in four weeks to assess Paxil's effectiveness -     PARoxetine HCl; Take 1 tablet (10 mg total) by mouth daily.  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 Will f/u after paxil. She reports that trazodone helps her fall asleep but she wakes up early in the morning. She is still getting a sufficient amount of sleep overall. Paxil might also help with sleep disturbances. - Continue trazodone for sleep - Monitor sleep patterns after starting Paxil    Encounter for behavioral health screening As part of their intake evaluation, the patient was screened for depression, anxiety.  PHQ9 SCORE 12, GAD7 SCORE 8. Screening results positive for tested conditions. See plan under problem/diagnosis above.  Clinical coverage for weight loss GLP's  Medication being dispensed is Wegovy 3 mL/28 day. Titration doses are 2 mL/28 days.  [x]  Product being prescribed is FDA approved for the indication, age, weight (if applicable) and not does not exceed dosing limits per the Prescribing Information per the clinical conditions for use.  [x]  Patient's baseline weight measured within the last 45 days as required by provider before dispensing.  [x]  Patient is new to  therapy and One of the following:   [x]  The beneficiary is 49 years of age or over and has ONE of the following:  [x]  A BMI greater than or equal to 30 kg/m2   [x]  The beneficiary is currently on and will continue lifestyle modification including structured nutrition and physical activity, unless physical activity is not clinically appropriate at the time GLP1 therapy commences AND  [x]  The beneficiary will NOT be using the requested agent in combination with another GLP-1 receptor agonist agent AND  [x]  The beneficiary does NOT have any FDA-labeled contraindications to the requested agent, including pregnancy, lactation, history of medullary thyroid cancer or multiple endocrine neoplasia type II.   Last BMI/Weight/Height recorded Estimated body mass index is 30.82 kg/m as calculated from the following:   Height as of 11/26/23: 5\' 3"  (1.6 m).   Weight as of this encounter: 174 lb (78.9 kg).   Follow up plan: Return in about 4 weeks (around 03/03/2024) for Mood, wt managemnet.  Jackolyn Confer, MD

## 2024-02-04 NOTE — Assessment & Plan Note (Signed)
 She experiences significant menopausal symptoms, including hot flashes, mood swings, and irritability, impacting her quality of life. Paxil, an antidepressant, is proposed at low doses to manage these symptoms, as it is effective for hot flashes. She has previously tolerated Paxil well. Discontinuation of Topamax is advised as it has not been effective for her symptoms. - Prescribe Paxil at a low dose for menopausal symptoms - Discontinue Topamax - Schedule follow-up in four weeks to assess Paxil's effectiveness

## 2024-02-21 ENCOUNTER — Ambulatory Visit
Admission: RE | Admit: 2024-02-21 | Discharge: 2024-02-21 | Disposition: A | Discharge status: Home / Self Care | Source: Ambulatory Visit | Attending: Student in an Organized Health Care Education/Training Program | Admitting: Student in an Organized Health Care Education/Training Program

## 2024-02-21 ENCOUNTER — Other Ambulatory Visit: Payer: BC Managed Care – PPO

## 2024-02-21 DIAGNOSIS — J439 Emphysema, unspecified: Secondary | ICD-10-CM | POA: Diagnosis not present

## 2024-02-21 DIAGNOSIS — R0602 Shortness of breath: Secondary | ICD-10-CM

## 2024-02-21 DIAGNOSIS — R911 Solitary pulmonary nodule: Secondary | ICD-10-CM

## 2024-03-04 ENCOUNTER — Encounter: Payer: Self-pay | Admitting: Student in an Organized Health Care Education/Training Program

## 2024-03-06 ENCOUNTER — Ambulatory Visit: Admitting: Pediatrics

## 2024-03-06 ENCOUNTER — Other Ambulatory Visit: Payer: Self-pay

## 2024-03-06 DIAGNOSIS — R0602 Shortness of breath: Secondary | ICD-10-CM

## 2024-03-12 ENCOUNTER — Ambulatory Visit (INDEPENDENT_AMBULATORY_CARE_PROVIDER_SITE_OTHER): Payer: BC Managed Care – PPO | Admitting: Student in an Organized Health Care Education/Training Program

## 2024-03-12 ENCOUNTER — Ambulatory Visit: Admitting: Student in an Organized Health Care Education/Training Program

## 2024-03-12 ENCOUNTER — Encounter: Payer: Self-pay | Admitting: Student in an Organized Health Care Education/Training Program

## 2024-03-12 VITALS — BP 124/70 | HR 80 | Temp 98.1°F | Ht 63.0 in | Wt 173.6 lb

## 2024-03-12 DIAGNOSIS — R911 Solitary pulmonary nodule: Secondary | ICD-10-CM | POA: Diagnosis not present

## 2024-03-12 DIAGNOSIS — J4520 Mild intermittent asthma, uncomplicated: Secondary | ICD-10-CM

## 2024-03-12 DIAGNOSIS — R0602 Shortness of breath: Secondary | ICD-10-CM

## 2024-03-12 DIAGNOSIS — Z87891 Personal history of nicotine dependence: Secondary | ICD-10-CM

## 2024-03-12 LAB — PULMONARY FUNCTION TEST
DL/VA % pred: 89 %
DL/VA: 3.88 ml/min/mmHg/L
DLCO unc % pred: 94 %
DLCO unc: 19.4 ml/min/mmHg
FEF 25-75 Post: 2.55 L/s
FEF 25-75 Pre: 1.59 L/s
FEF2575-%Change-Post: 60 %
FEF2575-%Pred-Post: 90 %
FEF2575-%Pred-Pre: 56 %
FEV1-%Change-Post: 12 %
FEV1-%Pred-Post: 84 %
FEV1-%Pred-Pre: 74 %
FEV1-Post: 2.34 L
FEV1-Pre: 2.08 L
FEV1FVC-%Change-Post: 0 %
FEV1FVC-%Pred-Pre: 92 %
FEV6-%Change-Post: 10 %
FEV6-%Pred-Post: 89 %
FEV6-%Pred-Pre: 81 %
FEV6-Post: 3.05 L
FEV6-Pre: 2.75 L
FEV6FVC-%Change-Post: 0 %
FEV6FVC-%Pred-Post: 100 %
FEV6FVC-%Pred-Pre: 101 %
FVC-%Change-Post: 11 %
FVC-%Pred-Post: 89 %
FVC-%Pred-Pre: 79 %
FVC-Post: 3.1 L
FVC-Pre: 2.77 L
Post FEV1/FVC ratio: 76 %
Post FEV6/FVC ratio: 99 %
Pre FEV1/FVC ratio: 75 %
Pre FEV6/FVC Ratio: 99 %
RV % pred: 375 %
RV: 6.3 L
TLC % pred: 191 %
TLC: 9.37 L

## 2024-03-12 MED ORDER — FLUTICASONE-SALMETEROL 100-50 MCG/ACT IN AEPB: 1.0000 | INHALATION_SPRAY | Freq: Two times a day (BID) | RESPIRATORY_TRACT | 11 refills | Status: AC

## 2024-03-12 NOTE — Patient Instructions (Signed)
 Full PFT completed today ? ?

## 2024-03-12 NOTE — Progress Notes (Signed)
 Full PFT completed today ? ?

## 2024-03-13 ENCOUNTER — Encounter (HOSPITAL_COMMUNITY): Payer: Self-pay

## 2024-03-15 NOTE — Progress Notes (Signed)
 Assessment & Plan:   1. Mild intermittent asthma without complication (Primary)  Patient is here for follow-up regarding shortness of breath, cough, and wheeze of 1 year duration in the setting of a strong family history of asthma as well as a smoking history.  She previously had in office spirometry where both FEV1 and FVC were decreased but the effort was suboptimal.  She has quit smoking and presents for follow-up after full pulmonary function testing.   PFTs show normal TLC and DLCO but with an obstructive defect on spirometry with a positive bronchodilator challenge with albuterol .  FEV1/FVC ratio are not consistent with COPD but this could represent a component of preserved ratio/impaired spirometry (prism ) of unclear significance at this point.  Her CT chest shows diffuse bronchial thickening with some tiny peripheral micronodules which is consistent with airway disease as well as likely smoking-related bronchiolitis.  Given the reversibility on her spirometry, I suspect asthma and will initiate her on ICS/LABA with Wixela.  I will clinically monitor her symptoms on follow-up with repeat PFTs likely at the 1 year mark for reevaluation of spirometry.  - fluticasone -salmeterol (WIXELA INHUB) 100-50 MCG/ACT AEPB; Inhale 1 puff into the lungs 2 (two) times daily.  Dispense: 60 each; Refill: 11  2. Pulmonary nodule  Patient has had an abdominal CT scan with pulmonary windows showing a subcentimeter pulmonary nodule in the left lower lobe.  Given her smoking history we elected to obtain a dedicated chest CT which is showing a few scattered lung nodules, largest being a groundglass nodule measuring 8 mm in the left upper lobe. The left lower lobe nodule initially being evaluated is stable and unchanged. I will follow-up LUL ground glass nodule with repeat CT chest in 6 months and if stable, space out the CTs to every 2 years.  -Chest CT without contrast in 6 months  Return in about 6 months  (around 09/12/2024).  I spent 32 minutes caring for this patient today, including preparing to see the patient, obtaining a medical history , reviewing a separately obtained history, performing a medically appropriate examination and/or evaluation, counseling and educating the patient/family/caregiver, ordering medications, tests, or procedures, documenting clinical information in the electronic health record, and independently interpreting results (not separately reported/billed) and communicating results to the patient/family/caregiver  Vergia Glasgow, MD [facility]  End of visit medications:  Meds ordered this encounter  Medications   fluticasone -salmeterol (WIXELA INHUB) 100-50 MCG/ACT AEPB    Sig: Inhale 1 puff into the lungs 2 (two) times daily.    Dispense:  60 each    Refill:  11     Current Outpatient Medications:    fluticasone -salmeterol (WIXELA INHUB) 100-50 MCG/ACT AEPB, Inhale 1 puff into the lungs 2 (two) times daily., Disp: 60 each, Rfl: 11   pantoprazole  (PROTONIX ) 40 MG tablet, Take 1 tablet (40 mg total) by mouth 2 (two) times daily before a meal., Disp: 180 tablet, Rfl: 1   PARoxetine  (PAXIL ) 10 MG tablet, Take 1 tablet (10 mg total) by mouth daily., Disp: 30 tablet, Rfl: 2   polyethylene glycol powder (GLYCOLAX /MIRALAX ) 17 GM/SCOOP powder, Take 255 g by mouth daily., Disp: 238 g, Rfl: 0   Semaglutide -Weight Management 0.25 MG/0.5ML SOAJ, Inject 0.25 mg into the skin once a week., Disp: 2 mL, Rfl: 0   traZODone  (DESYREL ) 50 MG tablet, TAKE 1/2 TO 1 (ONE-HALF TO ONE) TABLET BY MOUTH AT BEDTIME AS NEEDED FOR SLEEP, Disp: 30 tablet, Rfl: 2   varenicline  (CHANTIX ) 1  MG tablet, Take 1 tablet (1 mg total) by mouth 2 (two) times daily. Take 1 mg (2 tabs) twice daily, Disp: 120 tablet, Rfl: 0   Subjective:   PATIENT ID: Alyssa Cole GENDER: female DOB: Feb 15, 1976, MRN: [id number]  Chief Complaint  Patient presents with   Follow-up    F/u after PFT     HPI  Patient is a pleasant 48 year old female presenting for follow up.  She was seen in our clinic in January of 2025 for symptoms of exertional dyspnea (stairs, short distances) of one year in duration in addition to a cough productive of white/yellow sputum. There was also report of wheezing. Today, she continues to experience these symptoms. The wheezing is occasional but the cough ans shortness of breath persist. She had her PFT's as well as repeat CT chest and is here to discuss the results.  She was seen by her primary care physician where smoking cessation was discussed (patient successfully quit 10/2023 with Chantix  and nicotine  patches). She continues to abstain from smoking.   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  She has a notable smoking history, having started at the age of 73, and quit in December 2024.  She has around 30 pack years of smoking history.  She has no other personal past medical history.  She does have family history notable for asthma in both her children.  She has 3 dogs.  Ancillary information including prior medications, full medical/surgical/family/social histories, and PFTs (when available) are listed below and have been reviewed.   Review of Systems  Constitutional:  Negative for chills, fever and weight loss.  Respiratory:  Positive for cough, shortness of breath and wheezing. Negative for hemoptysis and sputum production.   Cardiovascular:  Negative for chest pain and palpitations.     Objective:   Vitals:   03/12/24 1534  BP: 124/70  Pulse: 80  Temp: 98.1 F (36.7 C)  TempSrc: Oral  SpO2: 97%  Weight: 173 lb 9.6 oz (78.7 kg)  Height: 5\' 3"  (1.6 m)   97% on RA BMI Readings from Last 3 Encounters:  03/12/24 30.75 kg/m  03/12/24 30.75 kg/m  02/04/24 30.82 kg/m   Wt Readings from Last 3 Encounters:  03/12/24 173 lb 9.6 oz (78.7 kg)  03/12/24 173 lb 9.6 oz (78.7 kg)  02/04/24 174 lb (78.9 kg)     Physical Exam Vitals reviewed.  Constitutional:      Appearance: Normal appearance.  Cardiovascular:     Rate and Rhythm: Normal rate and regular rhythm.     Pulses: Normal pulses.     Heart sounds: Normal heart sounds.  Pulmonary:     Effort: Pulmonary effort is normal.     Breath sounds: Normal breath sounds. No wheezing or rales.  Abdominal:     Palpations: Abdomen is soft.  Neurological:     General: No focal deficit present.     Mental Status: She is alert and oriented to person, place, and time. Mental status is at baseline.       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   Placenta previa 02/25/2013   Tobacco abuse 02/25/2013     Family History  Problem Relation Age of Onset   Diabetes Mother    Hypertension Mother    Heart  attack Mother    Hypertension Father    Dementia Father    Emphysema Maternal Grandmother    Bone cancer Maternal Grandfather    Tuberculosis Paternal Grandmother    Heart attack Paternal Grandfather      Past Surgical History:  Procedure Laterality Date   BIOPSY  11/21/2023   Procedure: BIOPSY;  Surgeon: Selena Daily, MD;  Location: [facility] ENDOSCOPY;  Service: Gastroenterology;;   CESAREAN SECTION     COLONOSCOPY WITH PROPOFOL  N/A 11/21/2023   Procedure: COLONOSCOPY WITH PROPOFOL ;  Surgeon: Selena Daily, MD;  Location: [facility] ENDOSCOPY;  Service: Gastroenterology;  Laterality: N/A;   ESOPHAGOGASTRODUODENOSCOPY (EGD) WITH PROPOFOL  N/A 11/21/2023   Procedure: ESOPHAGOGASTRODUODENOSCOPY (EGD) WITH PROPOFOL ;  Surgeon: Selena Daily, MD;  Location: [facility] ENDOSCOPY;  Service: Gastroenterology;  Laterality: N/A;   NASAL SINUS SURGERY      Social History   Socioeconomic History   Marital status: Married     Spouse name: Not on file   Number of children: Not on file   Years of education: Not on file   Highest education level: Not on file  Occupational History   Not on file  Tobacco Use   Smoking status: Former    Average packs/day: 1 pack/day for 15.0 years (15.0 ttl pk-yrs)    Types: Cigarettes    Start date: 10/07/2008   Smokeless tobacco: Never  Vaping Use   Vaping status: Never Used  Substance and Sexual Activity   Alcohol use: Yes    Comment: occ   Drug use: Never   Sexual activity: Yes  Other Topics Concern   Not on file  Social History Narrative   Not on file   Social Drivers of Health   Financial Resource Strain: Low Risk  (10/18/2023)   Overall Financial Resource Strain (CARDIA)    Difficulty of Paying Living Expenses: Not hard at all  Food Insecurity: No Food Insecurity (10/18/2023)   Hunger Vital Sign    Worried About Running Out of Food in the Last Year: Never true    Ran Out of Food in the Last Year: Never true  Transportation Needs: No Transportation Needs (10/18/2023)   PRAPARE - Administrator, Civil Service (Medical): No    Lack of Transportation (Non-Medical): No  Physical Activity: Insufficiently Active (10/18/2023)   Exercise Vital Sign    Days of Exercise per Week: 4 days    Minutes of Exercise per Session: 10 min  Stress: No Stress Concern Present (10/18/2023)   [employer] of Occupational Health - Occupational Stress Questionnaire    Feeling of Stress : Only a little  Social Connections: Socially Integrated (10/18/2023)   Social Connection and Isolation Panel [NHANES]    Frequency of Communication with Friends and Family: Never    Frequency of Social Gatherings with Friends and Family: More than three times a week    Attends Religious Services: More than 4 times per year    Active Member of [employer] or Organizations: Yes    Attends Banker Meetings: More than 4 times per year    Marital Status: Married  Catering manager  Violence: Not At Risk (10/18/2023)   Humiliation, Afraid, Rape, and Kick questionnaire    Fear of Current or Ex-Partner: No    Emotionally Abused: No    Physically Abused: No    Sexually Abused: No     No Known Allergies   CBC    Component Value Date/Time   WBC 7.2 10/14/2023 0729  RBC 4.16 10/14/2023 0729   HGB 13.3 10/14/2023 0729   HGB 14.0 09/20/2023 1050   HCT 39.0 10/14/2023 0729   HCT 41.7 09/20/2023 1050   PLT 262 10/14/2023 0729   PLT 309 09/20/2023 1050   MCV 93.8 10/14/2023 0729   MCV 96 09/20/2023 1050   MCV 95 02/25/2013 0606   MCH 32.0 10/14/2023 0729   MCHC 34.1 10/14/2023 0729   RDW 12.4 10/14/2023 0729   RDW 11.9 09/20/2023 1050   RDW 13.0 02/25/2013 0606   LYMPHSABS 3.1 09/20/2023 1050   LYMPHSABS 1.4 02/25/2013 0606   MONOABS 0.3 02/25/2013 0606   EOSABS 0.5 (H) 09/20/2023 1050   EOSABS 0.0 02/25/2013 0606   BASOSABS 0.0 09/20/2023 1050   BASOSABS 0.0 02/25/2013 0606    Pulmonary Functions Testing Results:    Latest Ref Rng & Units 03/12/2024    2:53 PM  PFT Results  FVC-Pre L 2.77   FVC-Predicted Pre % 79   FVC-Post L 3.10   FVC-Predicted Post % 89   Pre FEV1/FVC % % 75   Post FEV1/FCV % % 76   FEV1-Pre L 2.08   FEV1-Predicted Pre % 74   FEV1-Post L 2.34   DLCO uncorrected ml/min/mmHg 19.40   DLCO UNC% % 94   DLVA Predicted % 89   TLC L 9.37   TLC % Predicted % 191   RV % Predicted % 375     Outpatient Medications Prior to Visit  Medication Sig Dispense Refill   pantoprazole  (PROTONIX ) 40 MG tablet Take 1 tablet (40 mg total) by mouth 2 (two) times daily before a meal. 180 tablet 1   PARoxetine  (PAXIL ) 10 MG tablet Take 1 tablet (10 mg total) by mouth daily. 30 tablet 2   polyethylene glycol powder (GLYCOLAX /MIRALAX ) 17 GM/SCOOP powder Take 255 g by mouth daily. 238 g 0   Semaglutide -Weight Management 0.25 MG/0.5ML SOAJ Inject 0.25 mg into the skin once a week. 2 mL 0   traZODone  (DESYREL ) 50 MG tablet TAKE 1/2 TO 1 (ONE-HALF TO  ONE) TABLET BY MOUTH AT BEDTIME AS NEEDED FOR SLEEP 30 tablet 2   varenicline  (CHANTIX ) 1 MG tablet Take 1 tablet (1 mg total) by mouth 2 (two) times daily. Take 1 mg (2 tabs) twice daily 120 tablet 0   No facility-administered medications prior to visit.

## 2024-03-20 ENCOUNTER — Encounter: Payer: Self-pay | Admitting: Pediatrics

## 2024-03-20 ENCOUNTER — Ambulatory Visit (INDEPENDENT_AMBULATORY_CARE_PROVIDER_SITE_OTHER): Admitting: Pediatrics

## 2024-03-20 VITALS — BP 131/85 | HR 80 | Temp 97.8°F | Wt 171.2 lb

## 2024-03-20 DIAGNOSIS — F4325 Adjustment disorder with mixed disturbance of emotions and conduct: Secondary | ICD-10-CM

## 2024-03-20 DIAGNOSIS — Z1330 Encounter for screening examination for mental health and behavioral disorders, unspecified: Secondary | ICD-10-CM

## 2024-03-20 DIAGNOSIS — R232 Flushing: Secondary | ICD-10-CM

## 2024-03-20 DIAGNOSIS — Z6830 Body mass index (BMI) 30.0-30.9, adult: Secondary | ICD-10-CM

## 2024-03-20 MED ORDER — WEGOVY 1.7 MG/0.75ML ~~LOC~~ SOAJ: 1.7000 mg | SUBCUTANEOUS | 0 refills | Status: DC

## 2024-03-20 MED ORDER — WEGOVY 1 MG/0.5ML ~~LOC~~ SOAJ: 1.0000 mg | SUBCUTANEOUS | 0 refills | Status: DC

## 2024-03-20 MED ORDER — PAROXETINE HCL 10 MG PO TABS: 10.0000 mg | ORAL_TABLET | Freq: Every day | ORAL | 3 refills | Status: AC

## 2024-03-20 MED ORDER — WEGOVY 0.5 MG/0.5ML ~~LOC~~ SOAJ: 0.5000 mg | SUBCUTANEOUS | 0 refills | Status: DC

## 2024-03-20 NOTE — Patient Instructions (Signed)
 Alyssa Cole

## 2024-03-20 NOTE — Progress Notes (Signed)
 Office Visit  BP 131/85   Pulse 80   Temp 97.8 F (36.6 C) (Oral)   Wt 171 lb 3.2 oz (77.7 kg)   LMP  (LMP Unknown)   SpO2 98%   BMI 30.33 kg/m    Subjective:    Patient ID: Alyssa Cole, female    DOB: [birth year], 48 y.o.   MRN: [id number]  HPI: Alyssa Cole is a 48 y.o. female  Chief Complaint  Patient presents with   Follow-up    Discussed the use of AI scribe software for clinical note transcription with the patient, who gave verbal consent to proceed.  History of Present Illness   Alyssa Cole is a 48 year old female who presents for follow-up on weight management and medication review.  She is currently on Wegovy  for weight management and experiences mild nausea as a side effect. She is on the introductory dose and plans to increase the dose every four weeks, with the next increase approaching soon.  She takes Paxil  for menopausal symptoms, which has significantly reduced her hot flashes. She experiences mild drowsiness, so she takes it at night. She is on the lowest dose and feels stable at this level.  She uses Chantix  for smoking cessation and experiences lightheadedness when taken on an empty stomach, as well as vivid dreams, which she finds tolerable.  She occasionally uses Protonix  for acid reflux, particularly when consuming spicy foods. She does not take it daily and has an adequate supply.  Her pulmonologist suspects asthma and has prescribed a steroid inhaler, which she uses twice daily. This has improved her symptoms, reducing her cough.  A recent CT scan indicated aortic atherosclerosis. She is unsure about her cholesterol levels as they have not been checked recently. She is also monitoring a lung nodule, which requires follow-up in November.        Relevant past medical, surgical, family and social history reviewed and updated as indicated. Interim medical history since our last visit reviewed. Allergies and medications reviewed and  updated.  ROS per HPI unless specifically indicated above     Objective:     BP 131/85   Pulse 80   Temp 97.8 F (36.6 C) (Oral)   Wt 171 lb 3.2 oz (77.7 kg)   LMP  (LMP Unknown)   SpO2 98%   BMI 30.33 kg/m   Wt Readings from Last 3 Encounters:  03/20/24 171 lb 3.2 oz (77.7 kg)  03/12/24 173 lb 9.6 oz (78.7 kg)  03/12/24 173 lb 9.6 oz (78.7 kg)     Physical Exam Constitutional:      Appearance: Normal appearance.  Pulmonary:     Effort: Pulmonary effort is normal.  Musculoskeletal:        General: Normal range of motion.  Skin:    Comments: Normal skin color  Neurological:     General: No focal deficit present.     Mental Status: She is alert. Mental status is at baseline.  Psychiatric:        Mood and Affect: Mood normal.        Behavior: Behavior normal.        Thought Content: Thought content normal.         02/04/2024    8:12 AM 10/25/2023   11:03 AM 10/18/2023   12:04 PM 09/20/2023   10:07 AM  Depression screen PHQ 2/9  Decreased Interest 1 1 0 1  Down, Depressed, Hopeless 2 0 1 1  PHQ - 2 Score 3  1 1 2   Altered sleeping 2 0 0 2  Tired, decreased energy 2 1 1 2   Change in appetite 1 1 2 1   Feeling bad or failure about yourself  1 0 0 1  Trouble concentrating 2 1 1 1   Moving slowly or fidgety/restless 1 0 0 0  Suicidal thoughts 0 0 0 0  PHQ-9 Score 12 4 5 9   Difficult doing work/chores Somewhat difficult Not difficult at all Not difficult at all Somewhat difficult       02/04/2024    8:13 AM 10/25/2023   11:03 AM 10/18/2023   12:04 PM 09/20/2023   10:08 AM  GAD 7 : Generalized Anxiety Score  Nervous, Anxious, on Edge 1 1 1 1   Control/stop worrying 1 0 1 1  Worry too much - different things 1 0 1 1  Trouble relaxing 1 1 1 1   Restless 1 0 1 0  Easily annoyed or irritable 2 1 1 1   Afraid - awful might happen 1 0 0 0  Total GAD 7 Score 8 3 6 5   Anxiety Difficulty Somewhat difficult Not difficult at all Not difficult at all Somewhat difficult        Assessment & Plan:  Assessment & Plan   Adjustment disorder with mixed disturbance of emotions and conduct Hot flashes Assessment & Plan: Managed with Paxil , effective with mild sedation. No dose adjustment needed. - Continue Paxil  at current dose. - Renew prescription with 90-day supply. - Reassess in three months for dose adjustment.  Orders: -     PARoxetine  HCl; Take 1 tablet (10 mg total) by mouth daily.  Dispense: 90 tablet; Refill: 3   BMI 30.0-30.9,adult Assessment & Plan: Obesity managed with Wegovy , tolerated well. Dose escalation planned. - Continue Wegovy  with dose escalation every four weeks. - Send prescription for next three months. - Monitor for side effects and adjust dose if necessary.  Orders: -     Wegovy ; Inject 0.5 mg into the skin once a week.  Dispense: 2 mL; Refill: 0 -     Wegovy ; Inject 1 mg into the skin once a week.  Dispense: 2 mL; Refill: 0 -     Wegovy ; Inject 1.7 mg into the skin once a week.  Dispense: 3 mL; Refill: 0 -     Lipid panel; Future  Encounter for behavioral health screening As part of their intake evaluation, the patient was screened for depression, anxiety.  PHQ9 SCORE 12, GAD7 SCORE 8. Screening results positive for tested conditions. See plan under problem/diagnosis above.     Follow up plan: Return in about 3 months (around 06/20/2024) for wt management, Mood.  Hadassah Letters, MD

## 2024-03-25 ENCOUNTER — Encounter: Payer: Self-pay | Admitting: Pediatrics

## 2024-03-25 NOTE — Assessment & Plan Note (Signed)
 Managed with Paxil , effective with mild sedation. No dose adjustment needed. - Continue Paxil  at current dose. - Renew prescription with 90-day supply. - Reassess in three months for dose adjustment.

## 2024-03-25 NOTE — Assessment & Plan Note (Signed)
 Obesity managed with Wegovy , tolerated well. Dose escalation planned. - Continue Wegovy  with dose escalation every four weeks. - Send prescription for next three months. - Monitor for side effects and adjust dose if necessary.

## 2024-03-27 ENCOUNTER — Other Ambulatory Visit

## 2024-03-27 DIAGNOSIS — Z6830 Body mass index (BMI) 30.0-30.9, adult: Secondary | ICD-10-CM | POA: Diagnosis not present

## 2024-03-28 LAB — LIPID PANEL
Chol/HDL Ratio: 3.8 ratio (ref 0.0–4.4)
Cholesterol, Total: 205 mg/dL — ABNORMAL HIGH (ref 100–199)
HDL: 54 mg/dL (ref >39)
LDL Chol Calc (NIH): 130 mg/dL — ABNORMAL HIGH (ref 0–99)
Triglycerides: 116 mg/dL (ref 0–149)
VLDL Cholesterol Cal: 21 mg/dL (ref 5–40)

## 2024-03-31 ENCOUNTER — Ambulatory Visit: Payer: Self-pay | Admitting: Pediatrics

## 2024-04-09 ENCOUNTER — Other Ambulatory Visit: Payer: Self-pay | Admitting: Pediatrics

## 2024-04-09 DIAGNOSIS — G4700 Insomnia, unspecified: Secondary | ICD-10-CM

## 2024-04-10 NOTE — Telephone Encounter (Signed)
 Requested Prescriptions  Pending Prescriptions Disp Refills   traZODone  (DESYREL ) 50 MG tablet [Pharmacy Med Name: traZODone  HCl 50 MG Oral Tablet] 30 tablet 2    Sig: TAKE 1/2 TO 1 (ONE-HALF TO ONE) TABLET BY MOUTH AT BEDTIME AS NEEDED FOR SLEEP     Psychiatry: Antidepressants - Serotonin Modulator Passed - 04/10/2024  1:45 PM      Passed - Valid encounter within last 6 months    Recent Outpatient Visits           3 weeks ago Adjustment disorder with mixed disturbance of emotions and conduct   [place] [facility] Hadassah Letters, MD   2 months ago BMI 30.0-30.9,adult   [place] Hadassah Letters, MD

## 2024-04-13 ENCOUNTER — Other Ambulatory Visit: Payer: Self-pay | Admitting: Pediatrics

## 2024-04-13 DIAGNOSIS — Z6830 Body mass index (BMI) 30.0-30.9, adult: Secondary | ICD-10-CM

## 2024-04-14 NOTE — Telephone Encounter (Signed)
 Order already sent to start 04/19/24 for 1 mg   Requested Prescriptions  Refused Prescriptions Disp Refills   WEGOVY  0.5 MG/0.5ML SOAJ [Pharmacy Med Name: Wegovy  0.5 MG/0.5ML Subcutaneous Solution Auto-injector] 4 mL 0    Sig: INJECT 0.5MG  SUBCUTANEOUSLY ONCE A WEEK     Endocrinology:  Diabetes - GLP-1 Receptor Agonists - semaglutide  Failed - 04/14/2024 10:55 AM      Failed - HBA1C in normal range and within 180 days    Hgb A1c MFr Bld  Date Value Ref Range Status  09/20/2023 5.7 (H) 4.8 - 5.6 % Final    Comment:             Prediabetes: 5.7 - 6.4          Diabetes: >6.4          Glycemic control for adults with diabetes: <7.0          Passed - Cr in normal range and within 360 days    Creatinine  Date Value Ref Range Status  02/25/2013 0.28 (L) 0.60 - 1.30 mg/dL Final   Creatinine, Ser  Date Value Ref Range Status  10/14/2023 0.62 0.44 - 1.00 mg/dL Final         Passed - Valid encounter within last 6 months    Recent Outpatient Visits           3 weeks ago Adjustment disorder with mixed disturbance of emotions and conduct   [facility] Hadassah Letters, MD   2 months ago BMI 30.0-30.9,adult   [place] [facility] Hadassah Letters, MD

## 2024-04-30 ENCOUNTER — Other Ambulatory Visit: Payer: Self-pay | Admitting: Pediatrics

## 2024-04-30 DIAGNOSIS — F17210 Nicotine dependence, cigarettes, uncomplicated: Secondary | ICD-10-CM

## 2024-05-01 NOTE — Telephone Encounter (Signed)
 Requested medication (s) are due for refill today: yes  Requested medication (s) are on the active medication list: yes  Last refill:  02/04/24  Future visit scheduled: yes  Notes to clinic:   Manual Review: Do not refill starter pack. 1 mg tabs may be extended up to one year if the patient has quit smoking but still feels at risk for relapse.      Requested Prescriptions  Pending Prescriptions Disp Refills   varenicline  (CHANTIX ) 1 MG tablet [Pharmacy Med Name: Varenicline  Tartrate 1 MG Oral Tablet] 120 tablet 0    Sig: Take 1 tablet by mouth twice daily     Psychiatry:  Drug Dependence Therapy - varenicline  Failed - 05/01/2024 11:33 AM      Failed - Manual Review: Do not refill starter pack. 1 mg tabs may be extended up to one year if the patient has quit smoking but still feels at risk for relapse.      Failed - Cr in normal range and within 180 days    Creatinine  Date Value Ref Range Status  02/25/2013 0.28 (L) 0.60 - 1.30 mg/dL Final   Creatinine, Ser  Date Value Ref Range Status  10/14/2023 0.62 0.44 - 1.00 mg/dL Final         Passed - Completed PHQ-2 or PHQ-9 in the last 360 days      Passed - Valid encounter within last 6 months    Recent Outpatient Visits           1 month ago Adjustment disorder with mixed disturbance of emotions and conduct   [place] [facility] Hadassah SQUIBB, MD   2 months ago BMI 30.0-30.9,adult   [place] [facility] Hadassah SQUIBB, MD

## 2024-06-04 ENCOUNTER — Encounter: Payer: Self-pay | Admitting: Pediatrics

## 2024-06-04 ENCOUNTER — Ambulatory Visit (INDEPENDENT_AMBULATORY_CARE_PROVIDER_SITE_OTHER): Admitting: Pediatrics

## 2024-06-04 VITALS — BP 120/79 | HR 70 | Temp 98.2°F | Wt 168.6 lb

## 2024-06-04 DIAGNOSIS — F17210 Nicotine dependence, cigarettes, uncomplicated: Secondary | ICD-10-CM | POA: Diagnosis not present

## 2024-06-04 DIAGNOSIS — K219 Gastro-esophageal reflux disease without esophagitis: Secondary | ICD-10-CM | POA: Diagnosis not present

## 2024-06-04 DIAGNOSIS — Z6830 Body mass index (BMI) 30.0-30.9, adult: Secondary | ICD-10-CM

## 2024-06-04 MED ORDER — WEGOVY 2.4 MG/0.75ML ~~LOC~~ SOAJ: 2.4000 mg | SUBCUTANEOUS | 6 refills | Status: DC

## 2024-06-04 MED ORDER — PANTOPRAZOLE SODIUM 40 MG PO TBEC: 40.0000 mg | DELAYED_RELEASE_TABLET | Freq: Two times a day (BID) | ORAL | 1 refills | Status: DC

## 2024-06-04 NOTE — Progress Notes (Signed)
 Office Visit  BP 120/79   Pulse 70   Temp 98.2 F (36.8 C) (Oral)   Wt 168 lb 9.6 oz (76.5 kg)   LMP  (LMP Unknown)   SpO2 99%   BMI 29.87 kg/m    Subjective:    Patient ID: Alyssa Cole, female    DOB: 1975/12/01, 47 y.o.   MRN: [id number]  HPI: Alyssa Cole is a 48 y.o. female  Chief Complaint  Patient presents with   Weight Management Screening    Discussed the use of AI scribe software for clinical note transcription with the patient, who gave verbal consent to proceed.  History of Present Illness   Alyssa Cole is a 48 year old female who presents for follow-up regarding her weight management treatment with Wegovy .  She is currently on Wegovy  injections, having increased the dose to 1.7 mg. The first week was challenging, but she has been tolerating it well by the second and third weeks. She is considering increasing to the 2.4 mg dose after her third shot this Saturday. She has experienced weight loss, which she describes as 'not fun' but at a good pace. She is actively exercising, including swimming, and is trying to improve her diet. Fast food consumption upsets her stomach.  She has reduced her Chantix  dose to one pill a day, taken at night, due to morning nausea, which she attributes to the combination of Chantix  and Wegovy . This adjustment has been going well.  She has resumed taking pantoprazole  twice a day due to acid reflux symptoms, which she associates with the Wegovy  treatment. She has no more refills for pantoprazole .  The treatment has also helped with her hot flashes.        Relevant past medical, surgical, family and social history reviewed and updated as indicated. Interim medical history since our last visit reviewed. Allergies and medications reviewed and updated.  ROS per HPI unless specifically indicated above     Objective:    BP 120/79   Pulse 70   Temp 98.2 F (36.8 C) (Oral)   Wt 168 lb 9.6 oz (76.5 kg)   LMP  (LMP Unknown)    SpO2 99%   BMI 29.87 kg/m   Wt Readings from Last 3 Encounters:  06/04/24 168 lb 9.6 oz (76.5 kg)  03/20/24 171 lb 3.2 oz (77.7 kg)  03/12/24 173 lb 9.6 oz (78.7 kg)     Physical Exam      06/04/2024    4:03 PM 02/04/2024    8:12 AM 10/25/2023   11:03 AM 10/18/2023   12:04 PM 09/20/2023   10:07 AM  Depression screen PHQ 2/9  Decreased Interest 0 1 1 0 1  Down, Depressed, Hopeless 0 2 0 1 1  PHQ - 2 Score 0 3 1 1 2   Altered sleeping 0 2 0 0 2  Tired, decreased energy 1 2 1 1 2   Change in appetite 1 1 1 2 1   Feeling bad or failure about yourself  0 1 0 0 1  Trouble concentrating 1 2 1 1 1   Moving slowly or fidgety/restless 0 1 0 0 0  Suicidal thoughts 0 0 0 0 0  PHQ-9 Score 3 12 4 5 9   Difficult doing work/chores Not difficult at all Somewhat difficult Not difficult at all Not difficult at all Somewhat difficult       06/04/2024    4:04 PM 02/04/2024    8:13 AM 10/25/2023   11:03 AM 10/18/2023  12:04 PM  GAD 7 : Generalized Anxiety Score  Nervous, Anxious, on Edge 1 1 1 1   Control/stop worrying 0 1 0 1  Worry too much - different things 1 1 0 1  Trouble relaxing 1 1 1 1   Restless 0 1 0 1  Easily annoyed or irritable  2 1 1   Afraid - awful might happen 0 1 0 0  Total GAD 7 Score  8 3 6   Anxiety Difficulty Not difficult at all Somewhat difficult Not difficult at all Not difficult at all       Assessment & Plan:  Assessment & Plan   BMI 30.0-30.9,adult Assessment & Plan: She is on Wegovy  1.7 mg with initial nausea resolved and has achieved weight loss through exercise and dietary changes. She is ready for a dose increase. Future switch to Zepbound was discussed if necessary. Increase Wegovy  to 2.4 mg. Monitor weight loss and medication tolerance. Consider Zepbound if weight loss plateaus.  Orders: -     Wegovy ; Inject 2.4 mg into the skin once a week.  Dispense: 3 mL; Refill: 6  Gastroesophageal reflux disease without esophagitis Assessment & Plan: Reflux has  increased, likely due to Wegovy , but pantoprazole  is effective in managing symptoms. Prescribe pantoprazole  with refills and monitor reflux symptom improvement.  Orders: -     Pantoprazole  Sodium; Take 1 tablet (40 mg total) by mouth 2 (two) times daily before a meal.  Dispense: 180 tablet; Refill: 1  Cigarette nicotine  dependence without complication Assessment & Plan: She is using Chantix  for smoking cessation, with the dose reduced due to nausea. Continue Chantix  once daily and monitor for additional refill needs. Total time spent discussing: .     Follow up plan: Return in about 5 months (around 11/04/2024) for Physical.  Hadassah SHAUNNA Nett, MD

## 2024-06-08 ENCOUNTER — Encounter: Payer: Self-pay | Admitting: Pediatrics

## 2024-06-08 NOTE — Assessment & Plan Note (Signed)
 She is using Chantix  for smoking cessation, with the dose reduced due to nausea. Continue Chantix  once daily and monitor for additional refill needs. Total time spent discussing: .

## 2024-06-08 NOTE — Assessment & Plan Note (Signed)
 She is on Wegovy  1.7 mg with initial nausea resolved and has achieved weight loss through exercise and dietary changes. She is ready for a dose increase. Future switch to Zepbound was discussed if necessary. Increase Wegovy  to 2.4 mg. Monitor weight loss and medication tolerance. Consider Zepbound if weight loss plateaus.

## 2024-06-08 NOTE — Assessment & Plan Note (Signed)
 Reflux has increased, likely due to Wegovy , but pantoprazole  is effective in managing symptoms. Prescribe pantoprazole  with refills and monitor reflux symptom improvement.

## 2024-06-26 ENCOUNTER — Ambulatory Visit: Admitting: Pediatrics

## 2024-07-02 ENCOUNTER — Other Ambulatory Visit: Payer: Self-pay | Admitting: Pediatrics

## 2024-07-02 DIAGNOSIS — F17210 Nicotine dependence, cigarettes, uncomplicated: Secondary | ICD-10-CM

## 2024-07-03 NOTE — Telephone Encounter (Signed)
 Requested Prescriptions  Pending Prescriptions Disp Refills   varenicline  (CHANTIX ) 1 MG tablet [Pharmacy Med Name: Varenicline  Tartrate 1 MG Oral Tablet] 120 tablet 0    Sig: Take 1 tablet by mouth twice daily     Psychiatry:  Drug Dependence Therapy - varenicline  Failed - 07/03/2024  2:38 PM      Failed - Manual Review: Do not refill starter pack. 1 mg tabs may be extended up to one year if the patient has quit smoking but still feels at risk for relapse.      Failed - Cr in normal range and within 180 days    Creatinine  Date Value Ref Range Status  02/25/2013 0.28 (L) 0.60 - 1.30 mg/dL Final   Creatinine, Ser  Date Value Ref Range Status  10/14/2023 0.62 0.44 - 1.00 mg/dL Final         Passed - Completed PHQ-2 or PHQ-9 in the last 360 days      Passed - Valid encounter within last 6 months    Recent Outpatient Visits           4 weeks ago BMI 30.0-30.9,adult   [place] [facility] Hadassah SQUIBB, MD   3 months ago Adjustment disorder with mixed disturbance of emotions and conduct   [place] [facility] Hadassah SQUIBB, MD   5 months ago BMI 30.0-30.9,adult   [place] [facility] Hadassah SQUIBB, MD

## 2024-07-20 ENCOUNTER — Other Ambulatory Visit: Payer: Self-pay | Admitting: Pediatrics

## 2024-07-20 DIAGNOSIS — G4700 Insomnia, unspecified: Secondary | ICD-10-CM

## 2024-07-21 NOTE — Telephone Encounter (Signed)
 Requested Prescriptions  Pending Prescriptions Disp Refills   traZODone  (DESYREL ) 50 MG tablet [Pharmacy Med Name: traZODone  HCl 50 MG Oral Tablet] 90 tablet 0    Sig: TAKE ONE-HALF TO ONE TABLET BY MOUTH AT BEDTIME AS NEEDED FOR SLEEP     Psychiatry: Antidepressants - Serotonin Modulator Passed - 07/21/2024  2:29 PM      Passed - Valid encounter within last 6 months    Recent Outpatient Visits           1 month ago BMI 30.0-30.9,adult   [place] [facility] Hadassah SQUIBB, MD   4 months ago Adjustment disorder with mixed disturbance of emotions and conduct   [place] [facility] Hadassah SQUIBB, MD   5 months ago BMI 30.0-30.9,adult   [place] [facility] Hadassah SQUIBB, MD

## 2024-08-21 ENCOUNTER — Other Ambulatory Visit (HOSPITAL_COMMUNITY): Payer: Self-pay

## 2024-08-21 ENCOUNTER — Telehealth: Payer: Self-pay

## 2024-08-21 NOTE — Telephone Encounter (Signed)
 Pharmacy Patient Advocate Encounter   Received notification from Onbase that prior authorization for Wegovy  0.25MG /0.5ML auto-injectors is required/requested.   Insurance verification completed.   The patient is insured through [facility].   Per test claim: PA required; PA submitted to above mentioned insurance via Latent Key/confirmation #/EOC [id number] Status is pending

## 2024-08-31 NOTE — Telephone Encounter (Signed)
 Pharmacy Patient Advocate Encounter  Received notification from [facility] that Prior Authorization for Wegovy  has been DENIED.  Full denial letter will be uploaded to the media tab. See denial reason below.   PA #/Case ID/Reference #: [id number]

## 2024-09-03 ENCOUNTER — Other Ambulatory Visit: Payer: Self-pay | Admitting: Pediatrics

## 2024-09-03 DIAGNOSIS — F17210 Nicotine dependence, cigarettes, uncomplicated: Secondary | ICD-10-CM

## 2024-09-04 ENCOUNTER — Ambulatory Visit
Admission: RE | Admit: 2024-09-04 | Discharge: 2024-09-04 | Disposition: A | Discharge status: Home / Self Care | Source: Ambulatory Visit | Attending: Student in an Organized Health Care Education/Training Program | Admitting: Student in an Organized Health Care Education/Training Program

## 2024-09-04 DIAGNOSIS — R918 Other nonspecific abnormal finding of lung field: Secondary | ICD-10-CM | POA: Diagnosis not present

## 2024-09-04 DIAGNOSIS — J439 Emphysema, unspecified: Secondary | ICD-10-CM | POA: Diagnosis not present

## 2024-09-04 DIAGNOSIS — J4520 Mild intermittent asthma, uncomplicated: Secondary | ICD-10-CM

## 2024-09-04 NOTE — Telephone Encounter (Signed)
 Requested medication (s) are due for refill today: yes  Requested medication (s) are on the active medication list: yes  Last refill:  07/04/27  Future visit scheduled: no  Notes to clinic:  Manual Review: Do not refill starter pack. 1 mg tabs may be extended up to one year if the patient has quit smoking but still feels at risk for relapse.      Requested Prescriptions  Pending Prescriptions Disp Refills   varenicline  (CHANTIX ) 1 MG tablet [Pharmacy Med Name: Varenicline  Tartrate 1 MG Oral Tablet] 120 tablet 0    Sig: Take 1 tablet by mouth twice daily     Psychiatry:  Drug Dependence Therapy - varenicline  Failed - 09/04/2024  2:55 PM      Failed - Manual Review: Do not refill starter pack. 1 mg tabs may be extended up to one year if the patient has quit smoking but still feels at risk for relapse.      Failed - Cr in normal range and within 180 days    Creatinine  Date Value Ref Range Status  02/25/2013 0.28 (L) 0.60 - 1.30 mg/dL Final   Creatinine, Ser  Date Value Ref Range Status  10/14/2023 0.62 0.44 - 1.00 mg/dL Final         Passed - Completed PHQ-2 or PHQ-9 in the last 360 days      Passed - Valid encounter within last 6 months    Recent Outpatient Visits           3 months ago BMI 30.0-30.9,adult   [place] [facility] Hadassah SQUIBB, MD   5 months ago Adjustment disorder with mixed disturbance of emotions and conduct   [place] [facility] Hadassah SQUIBB, MD   7 months ago BMI 30.0-30.9,adult   [place] [facility] Hadassah SQUIBB, MD

## 2024-09-07 ENCOUNTER — Ambulatory Visit

## 2024-09-07 VITALS — Ht 62.99 in | Wt 154.4 lb

## 2024-09-07 DIAGNOSIS — Z7689 Persons encountering health services in other specified circumstances: Secondary | ICD-10-CM

## 2024-09-08 NOTE — Progress Notes (Signed)
 Weight check completed during nurse visit on 09/07/2024.  Clinical coverage for weight loss GLP's   Medication being dispensed is Wegovy .    [x]  Product being prescribed is FDA approved for the indication, age, weight (if applicable) and not does not exceed dosing limits per the Prescribing Information per the clinical conditions for use.  [x]  Patient's baseline weight measured within the last 45 days as required by provider before dispensing.  [x]  Patient is continuing weight management therapy and One of the following:   []  The beneficiary is 48 years of age or over and has ONE of the following:  []  A BMI greater than or equal to 30 kg/m2  [x]  A BMI greater than or equal to 27 kg/m2 with at least one weight-related comorbidity/risk factor/complication (i.e. hypertension, type 2 diabetes, obstructive sleep apnea, cardiovascular disease, dyslipidemia)  [x]  The beneficiary has participated in 6 months or greater of lifestyle modification and will continue while on medication including structured nutrition following provider recommend dietary modifications and physical activity, unless physical activity is not clinically appropriate at the time GLP1 therapy commences AND  [x]  The beneficiary will NOT be using the requested agent in combination with another GLP-1 receptor agonist agent AND  [x]  The beneficiary does NOT have any FDA-labeled contraindications to the requested agent, including pregnancy, lactation, history of medullary thyroid cancer or multiple endocrine neoplasia type II.   Last BMI/Weight/Height recorded Estimated body mass index is 29.87 kg/m as calculated from the following:   Height as of 03/12/24: 5' 3 (1.6 m).   Weight as of 06/04/24: 168 lb 9.6 oz (76.5 kg).

## 2024-09-10 ENCOUNTER — Encounter: Payer: Self-pay | Admitting: Pharmacy Technician

## 2024-09-10 ENCOUNTER — Other Ambulatory Visit (HOSPITAL_COMMUNITY): Payer: Self-pay

## 2024-09-10 NOTE — Telephone Encounter (Signed)
 A user error has taken place: encounter opened in error, closed for administrative reasons.

## 2024-09-10 NOTE — Telephone Encounter (Signed)
 Pharmacy Patient Advocate Encounter (Started new PA)   Received notification from Pt Calls Messages that prior authorization for Wegovy  2.4mg  is required/requested.   Insurance verification completed.   The patient is insured through [facility].  PA required; PA submitted to above mentioned insurance via Latent Key/confirmation #/EOC [id number] Status is pending

## 2024-09-11 ENCOUNTER — Other Ambulatory Visit (HOSPITAL_COMMUNITY): Payer: Self-pay

## 2024-09-15 ENCOUNTER — Other Ambulatory Visit (HOSPITAL_COMMUNITY): Payer: Self-pay

## 2024-09-15 NOTE — Telephone Encounter (Signed)
 Pharmacy Patient Advocate Encounter  Received notification from [facility] that Prior Authorization for Wegovy  2.4mg  has been CANCELLED. The PA system says the provider closed it. I found a  new CoverMyMeds key where the PA was requested again. I tried starting it once more. It did not retrieve clinical questions and said a PA wasn't required. So I did a test claim. The medication processes through, but the insurance just doesn't pay much on it. CO-Insurance is $1,147.73   PA #/Case ID/Reference #: [id number]

## 2024-09-16 ENCOUNTER — Other Ambulatory Visit (HOSPITAL_COMMUNITY): Payer: Self-pay

## 2024-09-25 ENCOUNTER — Ambulatory Visit: Payer: Self-pay | Admitting: Student in an Organized Health Care Education/Training Program

## 2024-09-25 ENCOUNTER — Ambulatory Visit: Admitting: Student in an Organized Health Care Education/Training Program

## 2024-10-16 ENCOUNTER — Other Ambulatory Visit: Payer: Self-pay | Admitting: Pediatrics

## 2024-10-16 DIAGNOSIS — G4700 Insomnia, unspecified: Secondary | ICD-10-CM

## 2024-10-19 NOTE — Telephone Encounter (Signed)
 Requested Prescriptions  Pending Prescriptions Disp Refills   traZODone  (DESYREL ) 50 MG tablet [Pharmacy Med Name: traZODone  HCl 50 MG Oral Tablet] 90 tablet 0    Sig: TAKE ONE-HALF TO ONE TABLET BY MOUTH AT BEDTIME AS NEEDED FOR SLEEP     Psychiatry: Antidepressants - Serotonin Modulator Passed - 10/19/2024  2:51 PM      Passed - Valid encounter within last 6 months    Recent Outpatient Visits           4 months ago BMI 30.0-30.9,adult   [place] [facility] Hadassah SQUIBB, MD   7 months ago Adjustment disorder with mixed disturbance of emotions and conduct   [place] [facility] Hadassah SQUIBB, MD   8 months ago BMI 30.0-30.9,adult   [place] Hadassah SQUIBB, MD

## 2024-10-23 ENCOUNTER — Encounter: Payer: Self-pay | Admitting: Pediatrics

## 2024-10-23 ENCOUNTER — Ambulatory Visit: Admitting: Pediatrics

## 2024-10-23 VITALS — BP 102/67 | HR 88 | Temp 98.7°F | Ht 63.0 in | Wt 149.8 lb

## 2024-10-23 DIAGNOSIS — F4325 Adjustment disorder with mixed disturbance of emotions and conduct: Secondary | ICD-10-CM | POA: Diagnosis not present

## 2024-10-23 DIAGNOSIS — R232 Flushing: Secondary | ICD-10-CM

## 2024-10-23 DIAGNOSIS — Z1330 Encounter for screening examination for mental health and behavioral disorders, unspecified: Secondary | ICD-10-CM | POA: Diagnosis not present

## 2024-10-23 DIAGNOSIS — Z6826 Body mass index (BMI) 26.0-26.9, adult: Secondary | ICD-10-CM

## 2024-10-23 DIAGNOSIS — Z0000 Encounter for general adult medical examination without abnormal findings: Secondary | ICD-10-CM | POA: Diagnosis not present

## 2024-10-23 MED ORDER — PAROXETINE HCL 20 MG PO TABS: 20.0000 mg | ORAL_TABLET | Freq: Every day | ORAL | 1 refills | Status: AC

## 2024-10-23 NOTE — Patient Instructions (Signed)
 Your last mammogram was 12/2023. Screening is every 1-2

## 2024-10-23 NOTE — Progress Notes (Unsigned)
 "  BP 102/67   Pulse 88   Temp 98.7 F (37.1 C) (Oral)   Ht 5' 3 (1.6 m)   Wt 149 lb 12.8 oz (67.9 kg)   LMP 08/19/2024 (Exact Date)   SpO2 98%   BMI 26.54 kg/m    Annual Physical Exam - Female  Subjective:   CC: Annual Exam   Alyssa Cole is a 48 y.o. female patient here for a preventative health maintenance exam. Additional topics discussed include:  #weight management Wegovy  going well, occasional nausea, well tolerated See weight trend below Wt Readings from Last 3 Encounters:  10/23/24 149 lb 12.8 oz (67.9 kg)  09/07/24 154 lb 6.4 oz (70 kg)  06/04/24 168 lb 9.6 oz (76.5 kg)   #mood Recent exacerbation   Health Habits: DIET: {Desc; diets:16563} EXERCISE: *** times/week on average, activities include {misc; exercise types:16438} DENTAL EXAM: {UTDSTATUS:31041} EYE EXAM: {UTDSTATUS:31041}                       Relevant Gynecologic History LMP: Patient's last menstrual period was 08/19/2024 (exact date).  Menstrual Status: {Menopause:31378}, Flow {Misc; menses description:16152} PAP History:  Result Date Procedure Results Follow-ups  10/25/2023 Cytology - PAP High risk HPV: Negative Adequacy: Satisfactory for evaluation; transformation zone component PRESENT. Diagnosis: - Negative for Intraepithelial Lesions or Malignancy (NILM) Diagnosis: - Benign reactive/reparative changes Microorganisms: Fungal organisms present consistent with Candida spp. Comment: Cellular changes consistent with hyperkeratosis. Comment: Normal Reference Range HPV - Negative     History abnormal PAP: {yes/no:20286}  Sexual activity: {sexual partners:315163} Family history breast, ovarian cancer: {yes/no:20286} Domestic Violence Screen, feels safe at home: {yes/no:20286}  family history includes Bone cancer in her maternal grandfather; Dementia in her father; Diabetes in her mother; Emphysema in her maternal grandmother; Heart attack in her mother and paternal grandfather;  Hypertension in her father and mother; Tuberculosis in her paternal grandmother.  Social History[1] Social History   Social History Narrative   Not on file    Social drivers questionnaire is reviewed and is positive for : {SDOH Challenges:24934}. Follow up: {sdoh follow up:67204::None}  Depression Screening:     10/23/2024    9:00 AM 06/04/2024    4:03 PM 02/04/2024    8:12 AM 10/25/2023   11:03 AM 10/18/2023   12:04 PM  Depression screen PHQ 2/9  Decreased Interest 1 0 1 1 0  Down, Depressed, Hopeless 1 0 2 0 1  PHQ - 2 Score 2 0 3 1 1   Altered sleeping 0 0 2 0 0  Tired, decreased energy 1 1 2 1 1   Change in appetite 0 1 1 1 2   Feeling bad or failure about yourself  0 0 1 0 0  Trouble concentrating 1 1 2 1 1   Moving slowly or fidgety/restless 0 0 1 0 0  Suicidal thoughts 0 0 0 0 0  PHQ-9 Score 4 3  12  4  5    Difficult doing work/chores Not difficult at all Not difficult at all Somewhat difficult Not difficult at all Not difficult at all     Data saved with a previous flowsheet row definition       10/23/2024    9:01 AM 06/04/2024    4:04 PM 02/04/2024    8:13 AM 10/25/2023   11:03 AM  GAD 7 : Generalized Anxiety Score  Nervous, Anxious, on Edge 1 1 1 1   Control/stop worrying 1 0 1 0  Worry too much - different things 1  1 1 0  Trouble relaxing 1 1 1 1   Restless 1 0 1 0  Easily annoyed or irritable 1  2 1   Afraid - awful might happen 1 0 1 0  Total GAD 7 Score 7  8 3   Anxiety Difficulty Not difficult at all Not difficult at all Somewhat difficult Not difficult at all    Mental Health Plan: {Plan:5807813826}  Self Management Goals  Goals   None     Health Maintenance Colon Cancer Screening : {UTDSTATUS:31041} Mammogram : {UTDSTATUS:31041} DXA scan : {UTDSTATUS:31041} Immunizations : {Immunizations:5306}  Review of Systems See HPI for relevant ROS.  Outpatient Medications Prior to Visit  Medication Sig Dispense Refill   fluticasone -salmeterol  (WIXELA INHUB) 100-50 MCG/ACT AEPB Inhale 1 puff into the lungs 2 (two) times daily. 60 each 11   pantoprazole  (PROTONIX ) 40 MG tablet Take 1 tablet (40 mg total) by mouth 2 (two) times daily before a meal. 180 tablet 1   PARoxetine  (PAXIL ) 10 MG tablet Take 1 tablet (10 mg total) by mouth daily. 90 tablet 3   polyethylene glycol powder (GLYCOLAX /MIRALAX ) 17 GM/SCOOP powder Take 255 g by mouth daily. 238 g 0   Semaglutide -Weight Management (WEGOVY ) 2.4 MG/0.75ML SOAJ Inject 2.4 mg into the skin once a week. 3 mL 6   traZODone  (DESYREL ) 50 MG tablet TAKE ONE-HALF TO ONE TABLET BY MOUTH AT BEDTIME AS NEEDED FOR SLEEP 90 tablet 0   varenicline  (CHANTIX ) 1 MG tablet Take 1 tablet by mouth twice daily 120 tablet 0   No facility-administered medications prior to visit.     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  Physical Exam ***   Assessment and Plan:   There are no diagnoses linked to this encounter.   This plan was discussed with the patient and questions were answered. There were no further concerns.  Follow up as indicated, or sooner should any new problems arise, if conditions worsen, or if they are otherwise concerned.   See patient instructions for additional  information.  Alyssa SHAUNNA Nett, MD  Family Medicine      Future Appointments  Date Time Provider Department Center  11/13/2024  9:00 AM Isadora Hose, MD [id number]      [1]  Social History Tobacco Use   Smoking status: Former    Average packs/day: 1 pack/day for 15.0 years (15.0 ttl pk-yrs)    Types: Cigarettes    Start date: 10/07/2008   Smokeless tobacco: Never  Vaping Use   Vaping status: Never Used  Substance Use Topics   Alcohol use: Yes    Comment: occ   Drug use: Never   "

## 2024-10-24 LAB — CBC WITH DIFFERENTIAL/PLATELET
Basophils Absolute: 0 x10E3/uL (ref 0.0–0.2)
Basos: 0 %
EOS (ABSOLUTE): 0.3 x10E3/uL (ref 0.0–0.4)
Eos: 3 %
Hematocrit: 38.8 % (ref 34.0–46.6)
Hemoglobin: 12.5 g/dL (ref 11.1–15.9)
Immature Grans (Abs): 0 x10E3/uL (ref 0.0–0.1)
Immature Granulocytes: 0 %
Lymphocytes Absolute: 2 x10E3/uL (ref 0.7–3.1)
Lymphs: 26 %
MCH: 31.3 pg (ref 26.6–33.0)
MCHC: 32.2 g/dL (ref 31.5–35.7)
MCV: 97 fL (ref 79–97)
Monocytes Absolute: 0.5 x10E3/uL (ref 0.1–0.9)
Monocytes: 7 %
Neutrophils Absolute: 4.9 x10E3/uL (ref 1.4–7.0)
Neutrophils: 64 %
Platelets: 249 x10E3/uL (ref 150–450)
RBC: 4 x10E6/uL (ref 3.77–5.28)
RDW: 12.1 % (ref 11.7–15.4)
WBC: 7.7 x10E3/uL (ref 3.4–10.8)

## 2024-10-24 LAB — COMPREHENSIVE METABOLIC PANEL WITH GFR
ALT: 13 IU/L (ref 0–32)
AST: 15 IU/L (ref 0–40)
Albumin: 4.3 g/dL (ref 3.9–4.9)
Alkaline Phosphatase: 39 IU/L — ABNORMAL LOW (ref 41–116)
BUN/Creatinine Ratio: 16 (ref 9–23)
BUN: 11 mg/dL (ref 6–24)
Bilirubin Total: 0.5 mg/dL (ref 0.0–1.2)
CO2: 21 mmol/L (ref 20–29)
Calcium: 9.4 mg/dL (ref 8.7–10.2)
Chloride: 104 mmol/L (ref 96–106)
Creatinine, Ser: 0.67 mg/dL (ref 0.57–1.00)
Globulin, Total: 2.3 g/dL (ref 1.5–4.5)
Glucose: 94 mg/dL (ref 70–99)
Potassium: 3.9 mmol/L (ref 3.5–5.2)
Sodium: 139 mmol/L (ref 134–144)
Total Protein: 6.6 g/dL (ref 6.0–8.5)
eGFR: 108 mL/min/1.73

## 2024-10-24 LAB — HEMOGLOBIN A1C
Est. average glucose Bld gHb Est-mCnc: 103 mg/dL
Hgb A1c MFr Bld: 5.2 % (ref 4.8–5.6)

## 2024-10-24 LAB — LIPID PANEL
Chol/HDL Ratio: 3 ratio (ref 0.0–4.4)
Cholesterol, Total: 171 mg/dL (ref 100–199)
HDL: 57 mg/dL
LDL Chol Calc (NIH): 103 mg/dL — ABNORMAL HIGH (ref 0–99)
Triglycerides: 54 mg/dL (ref 0–149)
VLDL Cholesterol Cal: 11 mg/dL (ref 5–40)

## 2024-10-24 LAB — HEPATITIS B SURFACE ANTIBODY, QUANTITATIVE: Hepatitis B Surf Ab Quant: <3.5 m[IU]/mL — ABNORMAL LOW

## 2024-10-25 ENCOUNTER — Encounter: Payer: Self-pay | Admitting: Pediatrics

## 2024-10-25 ENCOUNTER — Ambulatory Visit: Payer: Self-pay | Admitting: Pediatrics

## 2024-10-25 MED ORDER — WEGOVY 2.4 MG/0.75ML ~~LOC~~ SOAJ: 2.4000 mg | SUBCUTANEOUS | 6 refills | Status: AC

## 2024-10-25 NOTE — Assessment & Plan Note (Signed)
 On paxil  for mood and hot flashes. Given breakthrough mood symptoms, will increase paxil  to 20mg .

## 2024-10-25 NOTE — Assessment & Plan Note (Signed)
 Excellent response on wegovy . See weight trend below: Wt Readings from Last 3 Encounters:  10/23/24 149 lb 12.8 oz (67.9 kg)  09/07/24 154 lb 6.4 oz (70 kg)  06/04/24 168 lb 9.6 oz (76.5 kg)

## 2024-10-26 NOTE — Progress Notes (Signed)
 Scheduled

## 2024-10-27 ENCOUNTER — Ambulatory Visit

## 2024-11-04 ENCOUNTER — Ambulatory Visit (INDEPENDENT_AMBULATORY_CARE_PROVIDER_SITE_OTHER)

## 2024-11-04 DIAGNOSIS — Z23 Encounter for immunization: Secondary | ICD-10-CM | POA: Diagnosis not present

## 2024-11-04 NOTE — Progress Notes (Signed)
 Patient is in office today for a nurse visit for Immunization. Patient Injection was given in the  Left deltoid. Patient tolerated injection well.

## 2024-11-06 ENCOUNTER — Other Ambulatory Visit: Payer: Self-pay | Admitting: Pediatrics

## 2024-11-06 DIAGNOSIS — F17210 Nicotine dependence, cigarettes, uncomplicated: Secondary | ICD-10-CM

## 2024-11-13 ENCOUNTER — Ambulatory Visit: Admitting: Student in an Organized Health Care Education/Training Program

## 2024-11-24 ENCOUNTER — Other Ambulatory Visit: Payer: Self-pay

## 2024-11-24 DIAGNOSIS — K219 Gastro-esophageal reflux disease without esophagitis: Secondary | ICD-10-CM

## 2024-11-24 MED ORDER — PANTOPRAZOLE SODIUM 40 MG PO TBEC: 40.0000 mg | DELAYED_RELEASE_TABLET | Freq: Two times a day (BID) | ORAL | 1 refills | Status: AC

## 2025-01-25 ENCOUNTER — Ambulatory Visit: Admitting: Nurse Practitioner
